# Patient Record
Sex: Male | Born: 1950 | Race: White | Hispanic: No | Marital: Single | Smoking: Never smoker
Health system: Southern US, Community
[De-identification: ages and names within clinical notes are randomized; demographics above are authoritative.]

## PROBLEM LIST (undated history)

## (undated) DIAGNOSIS — Z5189 Encounter for other specified aftercare: Secondary | ICD-10-CM

## (undated) DIAGNOSIS — E079 Disorder of thyroid, unspecified: Secondary | ICD-10-CM

## (undated) DIAGNOSIS — N189 Chronic kidney disease, unspecified: Secondary | ICD-10-CM

## (undated) DIAGNOSIS — C8590 Non-Hodgkin lymphoma, unspecified, unspecified site: Secondary | ICD-10-CM

## (undated) DIAGNOSIS — K5790 Diverticulosis of intestine, part unspecified, without perforation or abscess without bleeding: Secondary | ICD-10-CM

## (undated) DIAGNOSIS — C801 Malignant (primary) neoplasm, unspecified: Secondary | ICD-10-CM

## (undated) HISTORY — DX: Non-Hodgkin lymphoma, unspecified, unspecified site: C85.90

## (undated) HISTORY — PX: PORTACATH PLACEMENT: SHX2246

## (undated) HISTORY — PX: LUNG BIOPSY: SHX232

## (undated) HISTORY — DX: Chronic kidney disease, unspecified: N18.9

## (undated) HISTORY — PX: PORT-A-CATH REMOVAL: SHX5289

## (undated) HISTORY — PX: TONSILLECTOMY: SUR1361

## (undated) HISTORY — DX: Disorder of thyroid, unspecified: E07.9

## (undated) HISTORY — DX: Diverticulosis of intestine, part unspecified, without perforation or abscess without bleeding: K57.90

## (undated) HISTORY — DX: Encounter for other specified aftercare: Z51.89

---

## 2010-02-20 ENCOUNTER — Encounter (INDEPENDENT_AMBULATORY_CARE_PROVIDER_SITE_OTHER): Payer: Self-pay | Admitting: *Deleted

## 2010-08-26 ENCOUNTER — Encounter (INDEPENDENT_AMBULATORY_CARE_PROVIDER_SITE_OTHER): Payer: Self-pay | Admitting: *Deleted

## 2010-10-01 ENCOUNTER — Encounter (INDEPENDENT_AMBULATORY_CARE_PROVIDER_SITE_OTHER): Payer: Self-pay | Admitting: *Deleted

## 2010-10-03 ENCOUNTER — Ambulatory Visit: Payer: Self-pay | Admitting: Internal Medicine

## 2010-10-17 ENCOUNTER — Ambulatory Visit: Payer: Self-pay | Admitting: Internal Medicine

## 2010-10-21 ENCOUNTER — Telehealth (INDEPENDENT_AMBULATORY_CARE_PROVIDER_SITE_OTHER): Payer: Self-pay | Admitting: *Deleted

## 2010-10-25 ENCOUNTER — Ambulatory Visit: Payer: Self-pay | Admitting: Oncology

## 2010-10-25 ENCOUNTER — Telehealth: Payer: Self-pay | Admitting: Internal Medicine

## 2010-10-25 DIAGNOSIS — C8313 Mantle cell lymphoma, intra-abdominal lymph nodes: Secondary | ICD-10-CM

## 2010-10-28 ENCOUNTER — Ambulatory Visit: Payer: Self-pay | Admitting: Internal Medicine

## 2010-10-28 LAB — CONVERTED CEMR LAB
ALT: 17 units/L (ref 0–53)
Basophils Absolute: 0 10*3/uL (ref 0.0–0.1)
CO2: 29 meq/L (ref 19–32)
Calcium: 8.9 mg/dL (ref 8.4–10.5)
Chloride: 104 meq/L (ref 96–112)
Creatinine, Ser: 1.1 mg/dL (ref 0.4–1.5)
Eosinophils Relative: 2.5 % (ref 0.0–5.0)
GFR calc non Af Amer: 75.71 mL/min (ref >60)
Glucose, Bld: 97 mg/dL (ref 70–99)
Hemoglobin: 14.3 g/dL (ref 13.0–17.0)
Lymphocytes Relative: 22.8 % (ref 12.0–46.0)
Monocytes Relative: 7.3 % (ref 3.0–12.0)
Neutro Abs: 3.8 10*3/uL (ref 1.4–7.7)
RBC: 4.46 M/uL (ref 4.22–5.81)
RDW: 13.3 % (ref 11.5–14.6)
Total Protein: 5.9 g/dL — ABNORMAL LOW (ref 6.0–8.3)
WBC: 5.8 10*3/uL (ref 4.5–10.5)

## 2010-10-29 LAB — CONVERTED CEMR LAB: LDH: 147 units/L (ref 94–250)

## 2010-11-04 ENCOUNTER — Ambulatory Visit (HOSPITAL_COMMUNITY)
Admission: RE | Admit: 2010-11-04 | Discharge: 2010-11-04 | Payer: Self-pay | Source: Home / Self Care | Admitting: Internal Medicine

## 2010-11-12 ENCOUNTER — Encounter: Payer: Self-pay | Admitting: Internal Medicine

## 2010-11-25 ENCOUNTER — Ambulatory Visit (HOSPITAL_COMMUNITY)
Admission: RE | Admit: 2010-11-25 | Discharge: 2010-11-25 | Payer: Self-pay | Source: Home / Self Care | Attending: Oncology | Admitting: Oncology

## 2010-11-25 ENCOUNTER — Ambulatory Visit: Payer: Self-pay | Admitting: Oncology

## 2010-11-26 ENCOUNTER — Ambulatory Visit (HOSPITAL_COMMUNITY)
Admission: RE | Admit: 2010-11-26 | Discharge: 2010-11-26 | Payer: Self-pay | Source: Home / Self Care | Attending: Oncology | Admitting: Oncology

## 2010-11-27 ENCOUNTER — Inpatient Hospital Stay (HOSPITAL_COMMUNITY)
Admission: AD | Admit: 2010-11-27 | Discharge: 2010-12-01 | Payer: Self-pay | Source: Home / Self Care | Attending: Oncology | Admitting: Oncology

## 2010-11-27 ENCOUNTER — Encounter: Payer: Self-pay | Admitting: Oncology

## 2010-12-06 LAB — CBC WITH DIFFERENTIAL/PLATELET
Basophils Absolute: 0 10*3/uL (ref 0.0–0.1)
Eosinophils Absolute: 0.1 10*3/uL (ref 0.0–0.5)
HGB: 13.9 g/dL (ref 13.0–17.1)
MONO#: 0 10*3/uL — ABNORMAL LOW (ref 0.1–0.9)
NEUT#: 0.2 10*3/uL — CL (ref 1.5–6.5)
RDW: 13.1 % (ref 11.0–14.6)
WBC: 0.9 10*3/uL — CL (ref 4.0–10.3)
lymph#: 0.5 10*3/uL — ABNORMAL LOW (ref 0.9–3.3)
nRBC: 0 % (ref 0–0)

## 2010-12-11 ENCOUNTER — Encounter: Payer: Self-pay | Admitting: Internal Medicine

## 2010-12-11 LAB — CBC WITH DIFFERENTIAL/PLATELET
Eosinophils Absolute: 0 10*3/uL (ref 0.0–0.5)
HCT: 38.6 % (ref 38.4–49.9)
LYMPH%: 10.9 % — ABNORMAL LOW (ref 14.0–49.0)
MONO#: 1 10*3/uL — ABNORMAL HIGH (ref 0.1–0.9)
NEUT#: 8.6 10*3/uL — ABNORMAL HIGH (ref 1.5–6.5)
NEUT%: 79 % — ABNORMAL HIGH (ref 39.0–75.0)
Platelets: 168 10*3/uL (ref 140–400)
WBC: 10.9 10*3/uL — ABNORMAL HIGH (ref 4.0–10.3)
lymph#: 1.2 10*3/uL (ref 0.9–3.3)

## 2010-12-18 ENCOUNTER — Inpatient Hospital Stay (HOSPITAL_COMMUNITY)
Admission: AD | Admit: 2010-12-18 | Discharge: 2010-12-22 | Payer: Self-pay | Source: Home / Self Care | Attending: Oncology | Admitting: Oncology

## 2010-12-18 LAB — LACTATE DEHYDROGENASE: LDH: 202 U/L (ref 94–250)

## 2010-12-18 LAB — CBC WITH DIFFERENTIAL/PLATELET
BASO%: 1 % (ref 0.0–2.0)
Basophils Absolute: 0.1 10*3/uL (ref 0.0–0.1)
EOS%: 0.3 % (ref 0.0–7.0)
Eosinophils Absolute: 0 10*3/uL (ref 0.0–0.5)
HCT: 36.6 % — ABNORMAL LOW (ref 38.4–49.9)
HGB: 12.6 g/dL — ABNORMAL LOW (ref 13.0–17.1)
LYMPH%: 9.4 % — ABNORMAL LOW (ref 14.0–49.0)
MCH: 30.5 pg (ref 27.2–33.4)
MCHC: 34.4 g/dL (ref 32.0–36.0)
MCV: 88.6 fL (ref 79.3–98.0)
MONO#: 1.2 10*3/uL — ABNORMAL HIGH (ref 0.1–0.9)
MONO%: 10.6 % (ref 0.0–14.0)
NEUT#: 9 10*3/uL — ABNORMAL HIGH (ref 1.5–6.5)
NEUT%: 78.7 % — ABNORMAL HIGH (ref 39.0–75.0)
Platelets: 325 10*3/uL (ref 140–400)
RBC: 4.13 10*6/uL — ABNORMAL LOW (ref 4.20–5.82)
RDW: 13.7 % (ref 11.0–14.6)
WBC: 11.4 10*3/uL — ABNORMAL HIGH (ref 4.0–10.3)
lymph#: 1.1 10*3/uL (ref 0.9–3.3)

## 2010-12-18 LAB — URINALYSIS, MICROSCOPIC - CHCC
Bilirubin (Urine): NEGATIVE
Blood: NEGATIVE
Glucose: NEGATIVE g/dL
Ketones: NEGATIVE mg/dL
Nitrite: NEGATIVE
Protein: NEGATIVE mg/dL
RBC count: NEGATIVE (ref 0–2)
Specific Gravity, Urine: 1.025 (ref 1.003–1.035)
pH: 6 (ref 4.6–8.0)

## 2010-12-18 LAB — COMPREHENSIVE METABOLIC PANEL
ALT: 32 U/L (ref 0–53)
AST: 27 U/L (ref 0–37)
Albumin: 3.6 g/dL (ref 3.5–5.2)
Alkaline Phosphatase: 58 U/L (ref 39–117)
BUN: 17 mg/dL (ref 6–23)
CO2: 28 mEq/L (ref 19–32)
Calcium: 9.2 mg/dL (ref 8.4–10.5)
Chloride: 104 mEq/L (ref 96–112)
Creatinine, Ser: 1.18 mg/dL (ref 0.40–1.50)
Glucose, Bld: 87 mg/dL (ref 70–99)
Potassium: 4.2 mEq/L (ref 3.5–5.3)
Sodium: 139 mEq/L (ref 135–145)
Total Bilirubin: 0.8 mg/dL (ref 0.3–1.2)
Total Protein: 6.6 g/dL (ref 6.0–8.3)

## 2010-12-18 LAB — URIC ACID: Uric Acid, Serum: 7.9 mg/dL — ABNORMAL HIGH (ref 4.0–7.8)

## 2010-12-25 ENCOUNTER — Ambulatory Visit (HOSPITAL_BASED_OUTPATIENT_CLINIC_OR_DEPARTMENT_OTHER): Payer: BC Managed Care – PPO | Admitting: Oncology

## 2010-12-27 LAB — CBC WITH DIFFERENTIAL/PLATELET
BASO%: 0.4 % (ref 0.0–2.0)
Basophils Absolute: 0 10*3/uL (ref 0.0–0.1)
EOS%: 6.9 % (ref 0.0–7.0)
Eosinophils Absolute: 0.2 10*3/uL (ref 0.0–0.5)
HCT: 34.2 % — ABNORMAL LOW (ref 38.4–49.9)
HGB: 11.9 g/dL — ABNORMAL LOW (ref 13.0–17.1)
LYMPH%: 16.8 % (ref 14.0–49.0)
MCH: 31.6 pg (ref 27.2–33.4)
MCHC: 34.7 g/dL (ref 32.0–36.0)
MCV: 90.8 fL (ref 79.3–98.0)
MONO#: 0 10*3/uL — ABNORMAL LOW (ref 0.1–0.9)
MONO%: 0.7 % (ref 0.0–14.0)
NEUT#: 2 10*3/uL (ref 1.5–6.5)
NEUT%: 75.2 % — ABNORMAL HIGH (ref 39.0–75.0)
Platelets: 173 10*3/uL (ref 140–400)
RBC: 3.76 10*6/uL — ABNORMAL LOW (ref 4.20–5.82)
RDW: 13.9 % (ref 11.0–14.6)
WBC: 2.6 10*3/uL — ABNORMAL LOW (ref 4.0–10.3)
lymph#: 0.4 10*3/uL — ABNORMAL LOW (ref 0.9–3.3)

## 2011-01-01 LAB — CBC WITH DIFFERENTIAL/PLATELET
BASO%: 0.5 % (ref 0.0–2.0)
Basophils Absolute: 0.1 10*3/uL (ref 0.0–0.1)
EOS%: 1 % (ref 0.0–7.0)
Eosinophils Absolute: 0.2 10*3/uL (ref 0.0–0.5)
HCT: 32.4 % — ABNORMAL LOW (ref 38.4–49.9)
HGB: 11.1 g/dL — ABNORMAL LOW (ref 13.0–17.1)
LYMPH%: 8.9 % — ABNORMAL LOW (ref 14.0–49.0)
MCH: 31.1 pg (ref 27.2–33.4)
MCHC: 34.2 g/dL (ref 32.0–36.0)
MCV: 91.1 fL (ref 79.3–98.0)
MONO#: 1.5 10*3/uL — ABNORMAL HIGH (ref 0.1–0.9)
MONO%: 8.9 % (ref 0.0–14.0)
NEUT#: 13.6 10*3/uL — ABNORMAL HIGH (ref 1.5–6.5)
NEUT%: 80.7 % — ABNORMAL HIGH (ref 39.0–75.0)
Platelets: 142 10*3/uL (ref 140–400)
RBC: 3.55 10*6/uL — ABNORMAL LOW (ref 4.20–5.82)
RDW: 14.1 % (ref 11.0–14.6)
WBC: 16.8 10*3/uL — ABNORMAL HIGH (ref 4.0–10.3)
lymph#: 1.5 10*3/uL (ref 0.9–3.3)

## 2011-01-02 ENCOUNTER — Encounter: Payer: Self-pay | Admitting: Internal Medicine

## 2011-01-06 ENCOUNTER — Ambulatory Visit (HOSPITAL_COMMUNITY)
Admission: RE | Admit: 2011-01-06 | Discharge: 2011-01-06 | Payer: Self-pay | Source: Home / Self Care | Attending: Oncology | Admitting: Oncology

## 2011-01-07 LAB — GLUCOSE, CAPILLARY: Glucose-Capillary: 91 mg/dL (ref 70–99)

## 2011-01-10 ENCOUNTER — Encounter: Payer: Self-pay | Admitting: Internal Medicine

## 2011-01-10 LAB — CBC WITH DIFFERENTIAL/PLATELET
BASO%: 0 % (ref 0.0–2.0)
EOS%: 0.6 % (ref 0.0–7.0)
MCH: 31.2 pg (ref 27.2–33.4)
MCHC: 33.9 g/dL (ref 32.0–36.0)
MONO#: 0 10*3/uL — ABNORMAL LOW (ref 0.1–0.9)
NEUT%: 90.6 % — ABNORMAL HIGH (ref 39.0–75.0)
RBC: 3.77 10*6/uL — ABNORMAL LOW (ref 4.20–5.82)
RDW: 15.9 % — ABNORMAL HIGH (ref 11.0–14.6)
WBC: 11.5 10*3/uL — ABNORMAL HIGH (ref 4.0–10.3)
lymph#: 1 10*3/uL (ref 0.9–3.3)

## 2011-01-13 NOTE — H&P (Signed)
NAMEDIMAS, SCHECK NO.:  [id number]  MEDICAL RECORD NO.:  [id number]          PATIENT TYPE:  INP  LOCATION:  1338                         FACILITY:  [facility]  PHYSICIAN:  Leighton Roach. Truett Perna, M.D. DATE OF BIRTH:  1951-07-16  DATE OF ADMISSION:  12/18/2010 DATE OF DISCHARGE:                             HISTORY & PHYSICAL   PATIENT IDENTIFICATION:  Mr. Mark [name] is a 60 year old with mantle cell lymphoma.  He is now admitted for cycle #2 of R-EPOCH chemotherapy.  HISTORY OF PRESENT ILLNESS:  Mr. Veltre underwent a routine screening colonoscopy by Dr. Marina Goodell on October 17, 2010.  An atypical appearing "polyp" was found at the ileocecal valve.  There was a prominent fold at the entrance of the ileocecal valve.  The polyp was removed and the ileocecal fold was biopsied.  The terminal ileum appeared normal.  The pathology from the ileocecal valve polyp and prominent fold both were involved with mantle cell lymphoma.  A cyclin D1 stain was positive. The lymphocytes were positive for CD5 and negative for CD10.  The lymphocytes were positive for CD20.  The Ki-67 proliferation marker returned at 40%.  A staging PET scan on November 04, 2010, confirmed hypermetabolic lymphadenopathy at the hepatoduodenal ligament, portocaval area, and at the small bowel mesentery.  A large lymph node was noted at the ileocolic mesentery.  Abnormal soft tissue attenuation was seen at the cecum with involvement of the ileocecal valve with a maximum SUV of 13.  Small hypermetabolic nodes were also seen in the inguinal regions and there was a small hypermetabolic femoral node.  There was no evidence for hypermetabolic disease in the chest or neck.  A bone marrow biopsy on December 12th was negative for involvement with lymphoma.  He saw Dr. Greggory Stallion at [facility] for a second opinion regarding management of the newly diagnosed mantle cell lymphoma and the indication for stem cell therapy.  Dr.  Greggory Stallion recommended induction therapy with R-EPOCH prior to proceeding with stem cell therapy.  He was admitted for a first cycle of R-EPOCH on December 14.  He tolerated the chemotherapy without significant acute toxicity.  When he returned on December 23, the absolute neutrophil cell count was low at 0.2 with a platelet count of 106,000.  On December 28, the ANC was up to 8.6 with a platelet count of 168,000.  He reports feeling completely well.  PAST MEDICAL HISTORY: 1. Mantle cell lymphoma as per HPI. 2. Status post removal of multiple basal cell skin cancers.  PAST SURGICAL HISTORY:  Tonsillectomy in 2nd grade.  CURRENT MEDICATIONS: 1. Compazine 10 mg q.6 h. p.r.n. 2. Juice Plus 4 capsules daily. 3. Vitamin D3 3000 units daily. 4. Multivitamin daily 5. Vitamin B-50 complex daily. 6. Advil 200 mg p.r.n.  ALLERGIES:  No known drug allergies.  FAMILY HISTORY:  His brother died following an allogeneic bone marrow transplant for treatment of lymphoma.  His mother died at age 27 of breast cancer.  His father had Alzheimer disease.  No other family history of cancer.  SOCIAL HISTORY:  He is divorced.  He is retired Buyer, retail.  Two children are  in good health.  He does not use tobacco.  He drinks alcohol on social occasions.  He has no transfusion history.  He denies risk factors for HIV and hepatitis.  REVIEW OF SYSTEMS:  CONSTITUTIONAL - negative.  RESPIRATORY - negative. CARDIAC - negative.  GI - negative.  GU - negative.  NEUROLOGIC: Negative.  PHYSICAL EXAMINATION:  VITAL SIGNS:  Blood pressure 124/83, pulse 82, temperature of 98.6. HEENT:  No thrush or ulcers. LUNGS:  Clear bilaterally. CARDIAC:  Regular rhythm.  No gallop. ABDOMEN:  Nontender.  No hepatosplenomegaly.  No mass. EXTREMITIES: No edema. NEUROLOGIC:  He is alert and oriented.  Motor exam appears grossly intact.  Right arm PICC site with faint erythema at the skin exit with a small amount of dried  blood.  No surrounding erythema or tenderness.  LABORATORY DATA:  Hemoglobin 12.6, platelets 325,000, white count 11.4, ANC 9.0.  Creatinine 1.18, potassium 4.2, bilirubin 0.8, AST 27, ALT 32, albumin 3.6, calcium 9.2, LDH 202, uric acid 7.9.  IMPRESSION: 1. Mantle cell lymphoma.     a.     Staging evaluation confirmed mantle cell lymphoma involving      a cecal mass and abdominal/pelvic lymph nodes.     b.     Status post cycle #1 R-EPOCH chemotherapy on November 27, 2010. 2. Neutropenia/thrombocytopenia following cycle #1 of R-EPOCH -     resolved. 3. History of multiple basal cell skin cancers.  PLAN: 1. Mr. Delima appears well.  He tolerated the first cycle of R-EPOCH     without significant acute toxicity.  He will     be admitted for cycle #2 of R-EPOCH today. 2. We will coordinate a restaging evaluation and stem cell therapy     with Dr. Greggory Stallion after this cycle.  We will coordinate a restaging     evaluation and stem cell therapy with Dr. Greggory Stallion after this cycle.     Leighton Roach Truett Perna, M.D.     GBS/MEDQ  D:  12/18/2010  T:  12/18/2010  Job:  [id number]  cc:   Marlaine Hind, MD Bone Marrow Transplant Program [facility]  Wilhemina Bonito. Marina Goodell, MD [address] [place] [address]  Jonita Albee, M.D. Fax: [phone]  Electronically Signed by Thornton Papas M.D. on 01/13/2011 02:16:50 PM

## 2011-01-14 ENCOUNTER — Telehealth (INDEPENDENT_AMBULATORY_CARE_PROVIDER_SITE_OTHER): Payer: Self-pay | Admitting: *Deleted

## 2011-01-14 NOTE — Progress Notes (Signed)
Summary: Dr. Dierdre Searles (Pathology results)  Phone Note From Other Clinic   Summary of Call: Dr.Li from pathology called to say path. showed a low to med. grade lymphoma and she will be forwarding report to Dr.Perry. Initial call taken by: Teryl Lucy RN,  October 21, 2010 11:55 AM

## 2011-01-14 NOTE — Miscellaneous (Signed)
Summary: [facility] previsit  Clinical Lists Changes  Medications: Added new medication of MOVIPREP 100 GM  SOLR (PEG-KCL-NACL-NASULF-NA ASC-C) As per prep instructions. - Signed Rx of MOVIPREP 100 GM  SOLR (PEG-KCL-NACL-NASULF-NA ASC-C) As per prep instructions.;  #1 x 0;  Signed;  Entered by: [name] RN;  Authorized by: Hilarie Fredrickson MD;  Method used: Electronically to [facility]*, [address], [place], [place]  [id number], Ph: [phone], Fax: [phone] Observations: Added new observation of NKA: T (10/03/2010 8:47)    Prescriptions: MOVIPREP 100 GM  SOLR (PEG-KCL-NACL-NASULF-NA ASC-C) As per prep instructions.  #1 x 0   Entered by:   [name] RN   Authorized by:   Hilarie Fredrickson MD   Signed by:   [name] RN on 10/03/2010   Method used:   Electronically to        [facility]* (retail)       [address]       [place], [place]  [id number]       Ph: [phone]       Fax: [phone]   RxID:   [id number]

## 2011-01-14 NOTE — Letter (Signed)
Summary: [facility]  [facility]   Imported By: Sherian Rein 11/18/2010 10:19:28  _____________________________________________________________________  External Attachment:    Type:   Image     Comment:   External Document

## 2011-01-14 NOTE — Progress Notes (Signed)
Summary: Returning call from yesterday  Phone Note Call from Patient Call back at [phone] cell   Call For: Dr Marina Goodell Summary of Call: Returning Dr Lamar Sprinkles call from yesterday. Initial call taken by: Leanor Kail [facility],  October 25, 2010 8:13 AM  Follow-up for Phone Call        We discussed the findings and recommendations in detail. Proceed with w/u per Dr Truett Perna and follow up w/ dr Truett Perna. Follow-up by: Hilarie Fredrickson MD,  October 25, 2010 1:01 PM     Appended Document: Returning call from yesterday Patient advised of PET scan and CT chest, abdomen, and pelvis ordered for 11/04/10 7:00.  Patient  is advised to be NPO aftermidnight and to arrive at Radiology at 6:45.  Patient  won't get oral contrast they will give it to him when he arrives at Radiology.  Patient will come one day next week for lab work.  He is asked to call back for any questions. Darcey Nora RN, [facility]  October 25, 2010 2:08 PM   Clinical Lists Changes  Problems: Added new problem of MANTLE CELL LYMPHOMA INTRA-ABDOMINAL LYMPH NODES (ICD-200.43) Orders: Added new Test order of PET Scan (PET Scan) - Signed Added new Test order of CT Abdomen/Pelvis w & w/o Contrast (CT A/P w&w/o Cont) - Signed Added new Test order of T-CT Chest w/CM (45409) - Signed

## 2011-01-14 NOTE — Letter (Signed)
Summary: Colonoscopy Letter  [facility]  [address] [place], [place] [address]   Phone: [phone]  Fax: [phone]      February 20, 2010 MRN: [id number]   [facility] [address] [place], [place]  [address]   Dear Mark [name],   According to your medical record, it is time for you to schedule a Colonoscopy. The American Cancer Society recommends this procedure as a method to detect early colon cancer. Patients with a family history of colon cancer, or a personal history of colon polyps or inflammatory bowel disease are at increased risk.  This letter has been generated based on the recommendations made at the time of your procedure. If you feel that in your particular situation this may no longer apply, please contact our office.  Please call our office at [phone] to schedule this appointment or to update your records at your earliest convenience.  Thank you for cooperating with [place] to provide you with the very best care possible.   Sincerely,  Wilhemina Bonito. Marina Goodell, M.D.  [facility] Gastroenterology Division [phone]

## 2011-01-14 NOTE — Letter (Signed)
Summary: Pre Visit Letter Revised  [facility]  [address] [place], [place] [address]   Phone: [phone]  Fax: [phone]        08/26/2010 MRN: [id number] [facility] [address] [place], [place]  [address]  [place]             Procedure Date:  10-17-10  Welcome to the Gastroenterology Division at [facility].    You are scheduled to see a nurse for your pre-procedure visit on 10-03-10 at 9:00A.M. on the 3rd floor at [facility], [address]  We ask that you try to arrive at our office 15 minutes prior to your appointment time to allow for check-in.  Please take a minute to review the attached form.  If you answer "Yes" to one or more of the questions on the first page, we ask that you call the person listed at your earliest opportunity.  If you answer "No" to all of the questions, please complete the rest of the form and bring it to your appointment.    Your nurse visit will consist of discussing your medical and surgical history, your immediate family medical history, and your medications.   If you are unable to list all of your medications on the form, please bring the medication bottles to your appointment and we will list them.  We will need to be aware of both prescribed and over the counter drugs.  We will need to know exact dosage information as well.    Please be prepared to read and sign documents such as consent forms, a financial agreement, and acknowledgement forms.  If necessary, and with your consent, a friend or relative is welcome to sit-in on the nurse visit with you.  Please bring your insurance card so that we may make a copy of it.  If your insurance requires a referral to see a specialist, please bring your referral form from your primary care physician.  No co-pay is required for this nurse visit.     If you cannot keep your appointment, please call [phone] to cancel or reschedule prior to your appointment date.   This allows [place] the opportunity to schedule an appointment for another patient in need of care.    Thank you for choosing [facility] for your medical needs.  We appreciate the opportunity to care for you.  Please visit [place] at our website  to learn more about our practice.  Sincerely, The Gastroenterology Division

## 2011-01-14 NOTE — Procedures (Signed)
Summary: Colonoscopy  Patient: [name] Note: All result statuses are Final unless otherwise noted.  Tests: (1) Colonoscopy (COL)   COL Colonoscopy           DONE     [facility]     [address]     [place], [place]  [address]           COLONOSCOPY PROCEDURE REPORT           PATIENT:  Mark, [name]  MR#:  [id number]     BIRTHDATE:  [birth year], 59 yrs. old  GENDER:  male     ENDOSCOPIST:  Wilhemina Bonito. Eda Keys, MD     REF. BY:  Screening Recall     PROCEDURE DATE:  10/17/2010     PROCEDURE:  Colonoscopy with biopsy,     Colonoscopy with snare polypectomy     x 1     ASA CLASS:  Class I     INDICATIONS:  Routine Risk Screening     MEDICATIONS:   Fentanyl 50 mcg IV, Versed 7 mg IV           DESCRIPTION OF PROCEDURE:   After the risks benefits and     alternatives of the procedure were thoroughly explained, informed     consent was obtained.  Digital rectal exam was performed and     revealed no abnormalities.   The LB 180AL [id number] endoscope was     introduced through the anus and advanced to the cecum, which was     identified by both the appendix and ileocecal valve, without     limitations.Time = 2:39 min.  The quality of the prep was     excellent, using MoviPrep.  The instrument was then slowly     withdrawn (time = 15:23 min) as the colon was fully examined.     <<PROCEDUREIMAGES>>           FINDINGS:  An atypical appearring diminutive polyp was found at     the ileocecal valve. Polyp was snared without cautery. Retrieval     was successful. As well, prominent fold to entrance of ICV (?     significance) was biopsied. The T.I. was deeply intubated and     normal. Moderate diverticulosis was found in the sigmoid colon.     Retroflexed views in the rectum revealed no abnormalities.    The     scope was then withdrawn from the patient and the procedure     completed.           COMPLICATIONS:  None     ENDOSCOPIC IMPRESSION:     1) Diminutive polyp at the  ileocecal valve - removed     2) Moderate diverticulosis in the sigmoid colon     3) Prominent fold ICV -bx     RECOMMENDATIONS:     1) Await biopsy results     2) Repeat colonoscopy in 5 years if polyp adenomatous; otherwise     10 years     ______________________________     Wilhemina Bonito. Eda Keys, MD           CC:  Robert Bellow, MD; The Patient           n.     eSIGNED:   Wilhemina Bonito. Eda Keys at 10/17/2010 09:32 AM           Tana Felts, [id number]  Note: An exclamation mark (!) indicates a result that was  not dispersed into the flowsheet. Document Creation Date: 10/17/2010 9:32 AM _______________________________________________________________________  (1) Order result status: Final Collection or observation date-time: 10/17/2010 09:05 Requested date-time:  Receipt date-time:  Reported date-time:  Referring Physician:   Ordering Physician: Fransico Setters [phone]) Specimen Source:  Source: Launa Grill Order Number: [id number] Lab site:

## 2011-01-14 NOTE — Letter (Signed)
Summary: [facility] Instructions  [facility]  [address] [place], [place] [address]   Phone: [phone]  Fax: [phone]       Mark [name]    1951-12-07    MRN: [id number]        Procedure Day [name]: [name] 10/17/10     Arrival Time:  7:30am     Procedure Time: 8:30am     Location of Procedure:                    _ X_  [facility] (4th Floor)                        PREPARATION FOR COLONOSCOPY WITH MOVIPREP   Starting 5 days prior to your procedure  SATURDAY 10/29  do not eat nuts, seeds, popcorn, corn, beans, peas,  salads, or any raw vegetables.  Do not take any fiber supplements (e.g. Metamucil, Citrucel, and Benefiber).  THE DAY BEFORE YOUR PROCEDURE         DATE: [facility] 11/02  1.  Drink clear liquids the entire day-NO SOLID FOOD  2.  Do not drink anything colored red or purple.  Avoid juices with pulp.  No orange juice.  3.  Drink at least 64 oz. (8 glasses) of fluid/clear liquids during the day to prevent dehydration and help the prep work efficiently.  CLEAR LIQUIDS INCLUDE: Water Jello Ice Popsicles Tea (sugar ok, no milk/cream) Powdered fruit flavored drinks Coffee (sugar ok, no milk/cream) Gatorade Juice: apple, white grape, white cranberry  Lemonade Clear bullion, consomm, broth Carbonated beverages (any kind) Strained chicken noodle soup Hard Candy                             4.  In the morning, mix first dose of MoviPrep solution:    Empty 1 Pouch A and 1 Pouch B into the disposable container    Add lukewarm drinking water to the top line of the container. Mix to dissolve    Refrigerate (mixed solution should be used within 24 hrs)  5.  Begin drinking the prep at 5:00 p.m. The MoviPrep container is divided by 4 marks.   Every 15 minutes drink the solution down to the next mark (approximately 8 oz) until the full liter is complete.   6.  Follow completed prep with 16 oz of clear liquid of your choice  (Nothing red or purple).  Continue to drink clear liquids until bedtime.  7.  Before going to bed, mix second dose of MoviPrep solution:    Empty 1 Pouch A and 1 Pouch B into the disposable container    Add lukewarm drinking water to the top line of the container. Mix to dissolve    Refrigerate  THE DAY OF YOUR PROCEDURE      DATE: THURSDAY  11/03  Beginning at  3:30 a.m. (5 hours before procedure):         1. Every 15 minutes, drink the solution down to the next mark (approx 8 oz) until the full liter is complete.  2. Follow completed prep with 16 oz. of clear liquid of your choice.    3. You may drink clear liquids until 6:30am  (2 HOURS BEFORE PROCEDURE).   MEDICATION INSTRUCTIONS  Unless otherwise instructed, you should take regular prescription medications with a small sip of water   as early as possible the morning  of your procedure.        OTHER INSTRUCTIONS  You will need a responsible adult at least 61 years of age to accompany you and drive you home.   This person must remain in the waiting room during your procedure.  Wear loose fitting clothing that is easily removed.  Leave jewelry and other valuables at home.  However, you may wish to bring a book to read or  an iPod/MP3 player to listen to music as you wait for your procedure to start.  Remove all body piercing jewelry and leave at home.  Total time from sign-in until discharge is approximately 2-3 hours.  You should go home directly after your procedure and rest.  You can resume normal activities the  day after your procedure.  The day of your procedure you should not:   Drive   Make legal decisions   Operate machinery   Drink alcohol   Return to work  You will receive specific instructions about eating, activities and medications before you leave.    The above instructions have been reviewed and explained to me by  [name] RN  October 03, 2010 9:13 AM     I fully  understand and can verbalize these instructions _____________________________ Date _________

## 2011-01-15 NOTE — Discharge Summary (Addendum)
Mark [name], Mark [name] NO.:  [id number]  MEDICAL RECORD NO.:  [id number]          PATIENT TYPE:  INP  LOCATION:  1341                         FACILITY:  [facility]  PHYSICIAN:  Leighton Roach. Truett Perna, M.D. DATE OF BIRTH:  Nov 18, 1951  DATE OF ADMISSION:  11/27/2010 DATE OF DISCHARGE:  12/01/2010                              DISCHARGE SUMMARY   REASON FOR HOSPITALIZATION:  Mr. Goodchild is a 60 year old Caucasian male with recent diagnosis of mantle cell lymphoma.  He was admitted for cycle #1 of R-EPOCH chemotherapy.  He has tolerated the chemotherapy without difficulty and has no difficulty passing stool or urine.  He has had chest pain.  He received his chemotherapy via a right PICC line and had no difficulties with his central line.  LABORATORY DATA:  Upon admission, CBC:  WBC 8.1, hemoglobin 14.6, hematocrit 43.1 and platelet count 194,000.  ANC 6.1.  Comprehensive metabolic panel was unremarkable except for mildly elevated glucose of 144.  LDH was 169 and serum uric acid was 7.1.  The patient had a repeat comprehensive metabolic panel on December 16, which was completely normal with sodium 143, potassium 3.9, chloride 109, CO2 28, glucose 96, BUN 13, creatinine 1.14, total bilirubin of 0.7, alkaline phosphatase 34, SGOT of 19, SGPT 15, total protein 5.3, albumin 3.0, calcium 8.5.  DISCHARGE DIAGNOSES:  Mantle cell lymphoma involving cecal mass and abdominal/pelvic lymph nodes.  The patient is completely asymptomatic. The plan is for the patient to receive 2 cycles of R-EPOCH prior to proceeding with stem cell therapy with Dr. Marlaine Hind at [facility]. The patient did have a marrow biopsy in November 25, 2010.  The preliminary report indicated the bone marrow was negative for involvement with lymphoma.  PHYSICAL EXAMINATION ON DAY OF DISCHARGE:  HEENT:  Normocephalic, EOMI. Sclera nonicteric. NECK:  Without mass. LUNGS:  Clear to auscultation bilaterally. CARDIAC:  S1, S2.  Regular rate and rhythm without murmurs, gallops or rubs. ABDOMEN:  Nontender. EXTREMITIES:  No edema.  The patient has a right arm PICC site with gauze dressing.  No surrounding erythema. NEUROLOGIC:  The patient is alert and oriented.  Motor exam appears grossly intact. VITAL SIGNS:  On day of discharge, temperature 97.9, pulse 52, respirations 16, blood pressure 144/87, O2 sats 98% on room air.  CONDITION OF PATIENT ON DISCHARGE:  Stable.  DISCHARGE INSTRUCTIONS:  The patient is instructed to resume normal activities.  He is to follow up with Dr. Truett Perna at the [facility] office on 12/19 for Neulasta shot.  He will also have labs and office visit as previously scheduled.  The patient is instructed to call for fever greater than 101 or for bleeding or uncontrolled nausea and vomiting.  DISCHARGE MEDICATIONS:  As follows: 1. Compazine 10 mg p.o. q.6 h p.r.n. 2. Aspirin 81 mg p.o. by mouth daily. 3. Fish oil pills 1 capsule by mouth daily. 4. Vitamin B complex 50 mg 1 tablet by mouth daily. 5. Vitamin D 3 3000 units 1 tablet by mouth p.o. daily.  SUMMARY:  Mr. Allende tolerated his chemotherapy without difficulty.  He did not experience any significant nausea,  vomiting, mucositis, diarrhea or alopecia.  He is aware of the risk for leukopenia and thrombocytopenia and infections.  He did not have any allergic reactions and no evidence of cardiac toxicity.  He is due to receive Neulasta support tomorrow.  He will then have a nadir CBC on December 06, 2010. He is scheduled for return office visit on December 11, 2010.     Bobbe Medico, PA-C   ______________________________ Leighton Roach Truett Perna, M.D.    SJ/MEDQ  D:  12/01/2010  T:  12/01/2010  Job:  [id number]  Electronically Signed by Thornton Papas M.D. on 12/18/2010 07:18:42 AM Electronically Signed by Bobbe Medico  on 01/15/2011 04:31:23 PM

## 2011-01-16 ENCOUNTER — Other Ambulatory Visit: Payer: Self-pay | Admitting: Oncology

## 2011-01-16 DIAGNOSIS — C8590 Non-Hodgkin lymphoma, unspecified, unspecified site: Secondary | ICD-10-CM

## 2011-01-16 NOTE — Letter (Signed)
Summary: [facility]  [facility]   Imported By: Sherian Rein 01/08/2011 12:30:47  _____________________________________________________________________  External Attachment:    Type:   Image     Comment:   External Document

## 2011-01-17 NOTE — Discharge Summary (Signed)
  NAMEZACHRY, HOPFENSPERGER NO.:  [id number]  MEDICAL RECORD NO.:  [id number]          PATIENT TYPE:  INP  LOCATION:  1338                         FACILITY:  [facility]  PHYSICIAN:  Leighton Roach. Truett Perna, M.D. DATE OF BIRTH:  [birth year]  DATE OF ADMISSION:  12/18/2010 DATE OF DISCHARGE:  12/22/2010                              DISCHARGE SUMMARY   DISCHARGE DIAGNOSES:  Mantle cell lymphoma.  HISTORY AND REASON FOR HOSPITALIZATION.:  Mr. Skolnick is a very pleasant 60 year old Caucasian male recently diagnosed with mantle cell lymphoma. He was admitted to the inpatient oncology service for cycle number #2 of R-EPOCH chemotherapy.  He tolerated his second cycle of R-EPOCH chemotherapy without difficulty, this was given via his right PICC line. The central line had no problems with access or flow and showed no signs of infection.  He remained afebrile throughout his entire admission.  DISCHARGE CONDITION:  Good.  DISCHARGE DIET:  Regular.  DISCHARGE MEDICATIONS: 1. Aspirin 81 mg 1 tablet by mouth daily. 2. Fish oil over-the-counter 1 capsule by mouth daily. 3. Vitamin B complex 50 mg 1 tablet by mouth daily. 4. Vitamin D3 5000 units 1 tablet by mouth every other day.  DISCHARGE FOLLOWUP:  The patient is to follow up with Dr. Truett Perna on December 23, 2010, for an office visit as well as a Neulasta injection. He will continue to receive his PICC line care at the [facility] and he is to call for any fever or bleeding.     Tiana Loft, PA-C.   ______________________________ Leighton Roach Truett Perna, M.D.    AJ/MEDQ  D:  12/22/2010  T:  12/22/2010  Job:  [id number]  Electronically Signed by Tiana Loft PA. on 01/16/2011 03:36:04 PM Electronically Signed by Thornton Papas M.D. on 01/17/2011 04:11:11 PM

## 2011-01-20 ENCOUNTER — Other Ambulatory Visit (HOSPITAL_COMMUNITY): Payer: Self-pay

## 2011-01-20 ENCOUNTER — Ambulatory Visit (HOSPITAL_COMMUNITY)
Admission: RE | Admit: 2011-01-20 | Discharge: 2011-01-20 | Disposition: A | Discharge status: Home / Self Care | Payer: BC Managed Care – PPO | Source: Ambulatory Visit | Attending: Oncology | Admitting: Oncology

## 2011-01-20 ENCOUNTER — Other Ambulatory Visit: Payer: Self-pay | Admitting: Oncology

## 2011-01-20 DIAGNOSIS — C8590 Non-Hodgkin lymphoma, unspecified, unspecified site: Secondary | ICD-10-CM

## 2011-01-20 DIAGNOSIS — C8319 Mantle cell lymphoma, extranodal and solid organ sites: Secondary | ICD-10-CM | POA: Insufficient documentation

## 2011-01-21 ENCOUNTER — Inpatient Hospital Stay (HOSPITAL_COMMUNITY)
Admission: AD | Admit: 2011-01-21 | Discharge: 2011-01-25 | DRG: 410 | Disposition: A | Discharge status: Home / Self Care | Payer: BC Managed Care – PPO | Source: Ambulatory Visit | Attending: Oncology | Admitting: Oncology

## 2011-01-21 ENCOUNTER — Encounter (HOSPITAL_BASED_OUTPATIENT_CLINIC_OR_DEPARTMENT_OTHER): Payer: BC Managed Care – PPO | Admitting: Oncology

## 2011-01-21 DIAGNOSIS — C8313 Mantle cell lymphoma, intra-abdominal lymph nodes: Secondary | ICD-10-CM | POA: Diagnosis present

## 2011-01-21 DIAGNOSIS — C8589 Other specified types of non-Hodgkin lymphoma, extranodal and solid organ sites: Secondary | ICD-10-CM

## 2011-01-21 DIAGNOSIS — Z7982 Long term (current) use of aspirin: Secondary | ICD-10-CM

## 2011-01-21 DIAGNOSIS — Z803 Family history of malignant neoplasm of breast: Secondary | ICD-10-CM

## 2011-01-21 DIAGNOSIS — Z807 Family history of other malignant neoplasms of lymphoid, hematopoietic and related tissues: Secondary | ICD-10-CM

## 2011-01-21 DIAGNOSIS — Z5111 Encounter for antineoplastic chemotherapy: Principal | ICD-10-CM

## 2011-01-21 DIAGNOSIS — Z5112 Encounter for antineoplastic immunotherapy: Secondary | ICD-10-CM

## 2011-01-21 DIAGNOSIS — C8319 Mantle cell lymphoma, extranodal and solid organ sites: Secondary | ICD-10-CM

## 2011-01-21 DIAGNOSIS — Z85828 Personal history of other malignant neoplasm of skin: Secondary | ICD-10-CM

## 2011-01-21 LAB — CBC WITH DIFFERENTIAL/PLATELET
EOS%: 2.4 % (ref 0.0–7.0)
MCH: 31.9 pg (ref 27.2–33.4)
MCV: 92.8 fL (ref 79.3–98.0)
MONO%: 8.7 % (ref 0.0–14.0)
NEUT#: 6.6 10*3/uL — ABNORMAL HIGH (ref 1.5–6.5)
RBC: 4.03 10*6/uL — ABNORMAL LOW (ref 4.20–5.82)
RDW: 17.2 % — ABNORMAL HIGH (ref 11.0–14.6)

## 2011-01-21 LAB — COMPREHENSIVE METABOLIC PANEL
ALT: 23 U/L (ref 0–53)
AST: 27 U/L (ref 0–37)
Albumin: 3.9 g/dL (ref 3.5–5.2)
Alkaline Phosphatase: 42 U/L (ref 39–117)
Potassium: 4.3 mEq/L (ref 3.5–5.3)
Sodium: 140 mEq/L (ref 135–145)
Total Protein: 6.5 g/dL (ref 6.0–8.3)

## 2011-01-22 NOTE — Progress Notes (Signed)
  Phone Note Other Incoming   Request: Send information Summary of Call: Request for records received from [facility]. Request forwarded to [employer].     

## 2011-01-22 NOTE — Letter (Signed)
Summary: [facility]  [facility]   Imported By: Sherian Rein 01/17/2011 10:43:02  _____________________________________________________________________  External Attachment:    Type:   Image     Comment:   External Document

## 2011-01-25 DIAGNOSIS — C8319 Mantle cell lymphoma, extranodal and solid organ sites: Secondary | ICD-10-CM

## 2011-01-26 ENCOUNTER — Encounter (HOSPITAL_BASED_OUTPATIENT_CLINIC_OR_DEPARTMENT_OTHER): Payer: BC Managed Care – PPO | Admitting: Oncology

## 2011-01-26 DIAGNOSIS — Z5189 Encounter for other specified aftercare: Secondary | ICD-10-CM

## 2011-01-26 DIAGNOSIS — C8319 Mantle cell lymphoma, extranodal and solid organ sites: Secondary | ICD-10-CM

## 2011-01-30 ENCOUNTER — Other Ambulatory Visit: Payer: Self-pay | Admitting: Oncology

## 2011-01-30 DIAGNOSIS — D702 Other drug-induced agranulocytosis: Secondary | ICD-10-CM

## 2011-01-30 LAB — CBC WITH DIFFERENTIAL/PLATELET
Eosinophils Absolute: 0.3 10*3/uL (ref 0.0–0.5)
LYMPH%: 44.3 % (ref 14.0–49.0)
MCHC: 34.4 g/dL (ref 32.0–36.0)
MCV: 93.1 fL (ref 79.3–98.0)
MONO%: 2.2 % (ref 0.0–14.0)
Platelets: 80 10*3/uL — ABNORMAL LOW (ref 140–400)
RBC: 3.54 10*6/uL — ABNORMAL LOW (ref 4.20–5.82)

## 2011-01-31 ENCOUNTER — Encounter (HOSPITAL_BASED_OUTPATIENT_CLINIC_OR_DEPARTMENT_OTHER): Payer: BC Managed Care – PPO | Admitting: Oncology

## 2011-01-31 ENCOUNTER — Other Ambulatory Visit: Payer: Self-pay | Admitting: Oncology

## 2011-01-31 DIAGNOSIS — C8319 Mantle cell lymphoma, extranodal and solid organ sites: Secondary | ICD-10-CM

## 2011-01-31 LAB — CBC WITH DIFFERENTIAL/PLATELET
BASO%: 0.6 % (ref 0.0–2.0)
EOS%: 15.9 % — ABNORMAL HIGH (ref 0.0–7.0)
LYMPH%: 56.6 % — ABNORMAL HIGH (ref 14.0–49.0)
MCHC: 35 g/dL (ref 32.0–36.0)
MONO#: 0.1 10*3/uL (ref 0.1–0.9)
MONO%: 9.1 % (ref 0.0–14.0)
Platelets: 46 10*3/uL — ABNORMAL LOW (ref 140–400)
RBC: 3.66 10*6/uL — ABNORMAL LOW (ref 4.20–5.82)
WBC: 1 10*3/uL — ABNORMAL LOW (ref 4.0–10.3)

## 2011-02-01 ENCOUNTER — Ambulatory Visit (HOSPITAL_COMMUNITY)
Admission: RE | Admit: 2011-02-01 | Discharge: 2011-02-01 | Disposition: A | Discharge status: Home / Self Care | Payer: BC Managed Care – PPO | Source: Ambulatory Visit | Attending: Oncology | Admitting: Oncology

## 2011-02-01 DIAGNOSIS — C8589 Other specified types of non-Hodgkin lymphoma, extranodal and solid organ sites: Secondary | ICD-10-CM | POA: Insufficient documentation

## 2011-02-01 LAB — CBC
HCT: 35.1 % — ABNORMAL LOW (ref 39.0–52.0)
Platelets: 71 10*3/uL — ABNORMAL LOW (ref 150–400)
RDW: 14.8 % (ref 11.5–15.5)
WBC: 2.7 10*3/uL — ABNORMAL LOW (ref 4.0–10.5)

## 2011-02-05 ENCOUNTER — Encounter: Payer: BC Managed Care – PPO | Admitting: Oncology

## 2011-02-07 ENCOUNTER — Other Ambulatory Visit: Payer: Self-pay | Admitting: Oncology

## 2011-02-07 ENCOUNTER — Encounter (HOSPITAL_BASED_OUTPATIENT_CLINIC_OR_DEPARTMENT_OTHER): Payer: BC Managed Care – PPO | Admitting: Oncology

## 2011-02-07 DIAGNOSIS — C8319 Mantle cell lymphoma, extranodal and solid organ sites: Secondary | ICD-10-CM

## 2011-02-07 LAB — CBC WITH DIFFERENTIAL/PLATELET
BASO%: 2.9 % — ABNORMAL HIGH (ref 0.0–2.0)
Basophils Absolute: 0.3 10*3/uL — ABNORMAL HIGH (ref 0.0–0.1)
HCT: 33.1 % — ABNORMAL LOW (ref 38.4–49.9)
HGB: 11.3 g/dL — ABNORMAL LOW (ref 13.0–17.1)
LYMPH%: 14.5 % (ref 14.0–49.0)
MCHC: 34.2 g/dL (ref 32.0–36.0)
MONO#: 0.8 10*3/uL (ref 0.1–0.9)
NEUT%: 73.5 % (ref 39.0–75.0)
Platelets: 269 10*3/uL (ref 140–400)
WBC: 9.3 10*3/uL (ref 4.0–10.3)
lymph#: 1.3 10*3/uL (ref 0.9–3.3)

## 2011-02-11 NOTE — Letter (Signed)
Summary: [facility]  [facility]   Imported By: Sherian Rein 02/03/2011 11:25:57  _____________________________________________________________________  External Attachment:    Type:   Image     Comment:   External Document

## 2011-02-13 ENCOUNTER — Encounter (HOSPITAL_BASED_OUTPATIENT_CLINIC_OR_DEPARTMENT_OTHER): Payer: BC Managed Care – PPO | Admitting: Oncology

## 2011-02-13 DIAGNOSIS — Z452 Encounter for adjustment and management of vascular access device: Secondary | ICD-10-CM

## 2011-02-13 DIAGNOSIS — C8319 Mantle cell lymphoma, extranodal and solid organ sites: Secondary | ICD-10-CM

## 2011-02-18 ENCOUNTER — Encounter (HOSPITAL_BASED_OUTPATIENT_CLINIC_OR_DEPARTMENT_OTHER): Payer: BC Managed Care – PPO | Admitting: Oncology

## 2011-02-18 DIAGNOSIS — C8319 Mantle cell lymphoma, extranodal and solid organ sites: Secondary | ICD-10-CM

## 2011-02-18 DIAGNOSIS — Z452 Encounter for adjustment and management of vascular access device: Secondary | ICD-10-CM

## 2011-02-21 NOTE — H&P (Signed)
NAMEQUINDARIUS, CABELLO NO.:  [id number]  MEDICAL RECORD NO.:  [id number]           PATIENT TYPE:  I  LOCATION:  1341                         FACILITY:  [facility]  PHYSICIAN:  Leighton Roach. Truett Perna, M.D. DATE OF BIRTH:  1951-06-01  DATE OF ADMISSION:  01/21/2011 DATE OF DISCHARGE:                             HISTORY & PHYSICAL   PATIENT IDENTIFICATION:  Mark [name] is a 60 year old with a history of mantle cell lymphoma.  He is now admitted for a third cycle of R-EPOCH chemotherapy.  HISTORY OF PRESENT ILLNESS:  Mark [name] underwent a routine screening colonoscopy by Dr. Marina Goodell on October 17, 2010.  An atypical-appearing "polyp" was found at the ileocecal valve.  There was a prominent fold at the entrance of the ileocecal valve.  The polyp was removed and the ileocecal fold was biopsied.  The terminal ileum appeared normal.  The pathology from the ileocecal valve polyp and prominent fold both wereinvolved with mantle cell lymphoma.  A cyclin D1 stain was positive. The lymphocytes were positive for CD5 and negative for CD10.  The lymphocytes were positive for CD20.  The Ki-67 proliferation marker returned at 40%.  A staging PET scan on November 04, 2010, confirmed hypermetabolic lymphadenopathy at the hepatoduodenal ligament, portocaval area, and at the small-bowel mesentery.  A large lymph node was noted at the ileocolic mesentery.  Abnormal soft tissue attenuation was seen at the cecum with involvement of the ileocecal valve with a maximum SUV of 13. Small hypermetabolic nodes were also seen in the inguinal regions and there was a small hypermetabolic femoral node.  There was no evidence for hypermetabolic disease in the neck or chest.  A bone marrow biopsy on November 25, 2010, was negative for involvement with lymphoma.  He saw Dr. Greggory Stallion at [facility] for a second opinion regarding management of the mantle cell lymphoma and the indication for stem cell therapy. Dr.  Greggory Stallion recommended induction therapy with R-EPOCH prior to stem cell therapy.  Mark [name] completed two cycles of R-EPOCH with the last cycle initiated on 01/04.  He tolerated the chemotherapy without significant acute toxicity.  A restaging PET scan on 01/23 revealed a significant improvement in the ileocecal adenopathy with reduced size and metabolic activity of nodal activity in this vicinity.  Hypermetabolic activity remained at the terminal ileum with a possible filling defect at the terminal ileum.  Other regional lymph nodes have resolved.  Small pelvic lymph nodes are no longer hypermetabolic.  I have reviewed the restaging CT with a Lyda Jester.  There is remaining abnormal activity near the cecum/ileocecal valve with an apparent soft tissue component though the hypermetabolic activity has improved.  Mark [name] saw Dr. Greggory Stallion last week.  He recommends a third cycle of R- EPOCH prior to stem cell therapy.  Mark [name] reports feeling well.  He underwent placement of an apheresis catheter on 02/06.  He is now admitted for a third cycle of R-EPOCH.  PAST MEDICAL HISTORY: 1. Mantle cell lymphoma as per HPI. 2. Status post removal of multiple basal cell skin cancers.  PAST SURGICAL HISTORY:  Tonsillectomy in the second grade.  CURRENT MEDICATIONS: 1. Compazine 10 mg q.6 h p.r.n. 2. Juice Plus supplement 4 capsules daily. 3. Vitamin D3 3000 units every other day. 4. Vitamin B50 complex 1 tablet every other day. 5. Advil 200 mg p.r.n. 6. Aspirin 81 mg every other day.  ALLERGIES:  No known drug allergies.  FAMILY HISTORY:  His brother died after an allogeneic bone marrow transplant for non-Hodgkin's lymphoma.  His mother died at age 56 of breast cancer.  His father had Alzheimer's disease.  No other family history of cancer.  SOCIAL HISTORY:  He is divorced.  He is a retired Buyer, retail.  Two children are in good health.  He does not use tobacco.  He  drinks alcohol on social occasions.  He has no transfusion history.  He denies risk factors for HIV and hepatitis.  REVIEW OF SYSTEMS:  CONSTITUTIONAL:  Negative.  RESPIRATORY:  Negative. HEART:  Negative.  GU:  Negative.  GI:  Negative.  NEUROLOGIC: Negative. INFECTIOUS DISEASE:  Negative.  PHYSICAL EXAMINATION:  VITAL SIGNS:  Blood pressure 129/78, pulse 78, temperature 97.8.  HEENT:  No thrush or ulcers.  There is an apheresis catheter in place at the right lower neck with an intact dressing. LUNGS:  Clear bilaterally.  HEART:  Regular rhythm.  ABDOMEN:  No hepatosplenomegaly.  No mass.  Nontender.  EXTREMITIES:  No edema.  LABORATORY DATA:  Hemoglobin 12.9, platelets 250,000, white count 8.5, ANC 6.6, BUN 15, creatinine 1.13.  Alkaline phosphatase 42, AST 27, ALT 23, bilirubin 1.0, calcium 9.2, LDH 150.  IMPRESSION: 1. Mantle cell lymphoma:     a.     Staging evaluation confirmed mantle cell lymphoma involving      a cecal mass and abdominal/pelvic lymph nodes.     b.     Status post two cycles of R-EPOCH chemotherapy with cycle #2      initiated on December 18, 2010.     c.     Restaging PET scan on January 06, 2011, revealed a marked      decrease in the hypermetabolic mass at the cecum and resolution of      hypermetabolic activity in small pelvic lymph nodes.  There is      remaining hypermetabolic activity with a soft tissue component at      the ileocecal region. 2. Status post placement of an apheresis catheter on January 20, 2011.  Mark [name] appears well.  He will be admitted for cycle #3 of R-EPOCH today.  He has tolerated the first two cycles of R-EPOCH without significant acute toxicity.  He saw Dr. Greggory Stallion last week.  I discussed the case with Dr. Greggory Stallion and the plan is to proceed with high-dose chemotherapy and autologous stem cell support following this cycle of chemotherapy.  Mark [name] will again receive Neulasta support following chemotherapy.     Leighton Roach  Truett Perna, M.D.     GBS/MEDQ  D:  01/21/2011  T:  01/21/2011  Job:  [id number]  cc:   Jaclyn Prime. Greggory Stallion, M.D. Fax: [phone]  Wilhemina Bonito. Marina Goodell, MD [address] [place] [address]  Electronically Signed by Thornton Papas M.D. on 02/20/2011 08:30:31 PM

## 2011-02-24 LAB — HIV ANTIBODY (ROUTINE TESTING W REFLEX): HIV: NONREACTIVE

## 2011-02-24 LAB — COMPREHENSIVE METABOLIC PANEL
Albumin: 3 g/dL — ABNORMAL LOW (ref 3.5–5.2)
Alkaline Phosphatase: 34 U/L — ABNORMAL LOW (ref 39–117)
BUN: 13 mg/dL (ref 6–23)
CO2: 28 mEq/L (ref 19–32)
Chloride: 109 mEq/L (ref 96–112)
Creatinine, Ser: 1.14 mg/dL (ref 0.4–1.5)
GFR calc non Af Amer: >60 mL/min (ref >60)
Glucose, Bld: 96 mg/dL (ref 70–99)
Potassium: 3.9 mEq/L (ref 3.5–5.1)
Total Bilirubin: 0.7 mg/dL (ref 0.3–1.2)

## 2011-02-25 LAB — COMPREHENSIVE METABOLIC PANEL
AST: 25 U/L (ref 0–37)
BUN: 15 mg/dL (ref 6–23)
CO2: 27 mEq/L (ref 19–32)
Calcium: 9.3 mg/dL (ref 8.4–10.5)
Creatinine, Ser: 1.18 mg/dL (ref 0.4–1.5)
GFR calc Af Amer: >60 mL/min (ref >60)
GFR calc non Af Amer: >60 mL/min (ref >60)
Glucose, Bld: 144 mg/dL — ABNORMAL HIGH (ref 70–99)

## 2011-02-25 LAB — LACTATE DEHYDROGENASE: LDH: 169 U/L (ref 94–250)

## 2011-02-25 LAB — CBC
Hemoglobin: 14.6 g/dL (ref 13.0–17.0)
MCH: 31.6 pg (ref 26.0–34.0)
MCHC: 33.9 g/dL (ref 30.0–36.0)
MCV: 93 fL (ref 78.0–100.0)
Platelets: 181 10*3/uL (ref 150–400)
RBC: 4.54 MIL/uL (ref 4.22–5.81)
RDW: 13.6 % (ref 11.5–15.5)
WBC: 5.8 10*3/uL (ref 4.0–10.5)

## 2011-02-25 LAB — URIC ACID: Uric Acid, Serum: 7.1 mg/dL (ref 4.0–7.8)

## 2011-02-25 LAB — PROTIME-INR
INR: 0.98 (ref 0.00–1.49)
Prothrombin Time: 13.2 seconds (ref 11.6–15.2)

## 2011-02-25 LAB — CHROMOSOME ANALYSIS, BONE MARROW

## 2011-02-25 LAB — BONE MARROW EXAM

## 2011-02-25 LAB — APTT: aPTT: 28 seconds (ref 24–37)

## 2011-02-25 LAB — DIFFERENTIAL
Lymphocytes Relative: 17 % (ref 12–46)
Lymphs Abs: 1.4 10*3/uL (ref 0.7–4.0)
Neutro Abs: 6.1 10*3/uL (ref 1.7–7.7)
Neutrophils Relative %: 76 % (ref 43–77)

## 2011-02-25 LAB — HEPATITIS B CORE ANTIBODY, TOTAL: Hep B Core Total Ab: NEGATIVE

## 2011-02-25 LAB — HEPATITIS B SURFACE ANTIGEN: Hepatitis B Surface Ag: NEGATIVE

## 2011-02-25 LAB — GLUCOSE, CAPILLARY: Glucose-Capillary: 99 mg/dL (ref 70–99)

## 2011-03-03 ENCOUNTER — Other Ambulatory Visit: Payer: Self-pay | Admitting: Oncology

## 2011-03-03 ENCOUNTER — Encounter (HOSPITAL_BASED_OUTPATIENT_CLINIC_OR_DEPARTMENT_OTHER): Payer: BC Managed Care – PPO | Admitting: Oncology

## 2011-03-03 DIAGNOSIS — C8319 Mantle cell lymphoma, extranodal and solid organ sites: Secondary | ICD-10-CM

## 2011-03-03 DIAGNOSIS — Z452 Encounter for adjustment and management of vascular access device: Secondary | ICD-10-CM

## 2011-03-03 LAB — CBC WITH DIFFERENTIAL/PLATELET
BASO%: 0.3 % (ref 0.0–2.0)
EOS%: 0.5 % (ref 0.0–7.0)
MCH: 31.6 pg (ref 27.2–33.4)
MCHC: 33.9 g/dL (ref 32.0–36.0)
MCV: 93.3 fL (ref 79.3–98.0)
MONO%: 0.3 % (ref 0.0–14.0)
RDW: 14.7 % — ABNORMAL HIGH (ref 11.0–14.6)
lymph#: 0.1 10*3/uL — ABNORMAL LOW (ref 0.9–3.3)

## 2011-03-03 LAB — BASIC METABOLIC PANEL
Chloride: 104 mEq/L (ref 96–112)
Creatinine, Ser: 1 mg/dL (ref 0.40–1.50)
Potassium: 3.9 mEq/L (ref 3.5–5.3)

## 2011-03-03 LAB — MAGNESIUM: Magnesium: 2 mg/dL (ref 1.5–2.5)

## 2011-03-05 ENCOUNTER — Encounter (HOSPITAL_BASED_OUTPATIENT_CLINIC_OR_DEPARTMENT_OTHER): Payer: BC Managed Care – PPO | Admitting: Oncology

## 2011-03-05 ENCOUNTER — Other Ambulatory Visit: Payer: Self-pay | Admitting: Oncology

## 2011-03-05 DIAGNOSIS — C8319 Mantle cell lymphoma, extranodal and solid organ sites: Secondary | ICD-10-CM

## 2011-03-05 DIAGNOSIS — Z452 Encounter for adjustment and management of vascular access device: Secondary | ICD-10-CM

## 2011-03-05 LAB — CBC WITH DIFFERENTIAL/PLATELET
HGB: 10.7 g/dL — ABNORMAL LOW (ref 13.0–17.1)
MCV: 90.6 fL (ref 79.3–98.0)
Platelets: 31 10*3/uL — ABNORMAL LOW (ref 140–400)
RBC: 3.39 10*6/uL — ABNORMAL LOW (ref 4.20–5.82)
RDW: 14 % (ref 11.0–14.6)
WBC: <0.2 10*3/uL — CL (ref 4.0–10.3)

## 2011-03-06 ENCOUNTER — Encounter: Payer: BC Managed Care – PPO | Admitting: Oncology

## 2011-03-11 NOTE — Discharge Summary (Signed)
  NAMEHUTSON, Mark [name] NO.:  [id number]  MEDICAL RECORD NO.:  [id number]           PATIENT TYPE:  I  LOCATION:  1341                         FACILITY:  [facility]  PHYSICIAN:  Samul Dada, M.D.DATE OF BIRTH:  1951/05/06  DATE OF ADMISSION:  01/21/2011 DATE OF DISCHARGE:  01/25/2011                              DISCHARGE SUMMARY   DISCHARGE DIAGNOSES: 1. Admission for third cycle of Rituxan-EPOCH chemotherapy. 2. Mantle cell lymphoma. 3. Status post resection of multiple basal cell skin cancers.  PROCEDURES:  Administration of chemotherapy.  HISTORY:  Mark [name] is a 60 year old white male who was admitted to [facility] on Tuesday February 7 for administration of his third cycle of R-EPOCH chemotherapy in anticipation ultimately of high-dose therapy and stem cell rescue.  The patient has felt well, apparently had no complaints on admission.  On physical exam, there were no remarkable features.  Baseline lab data.  Hemoglobin was 12.9, white count 8.5, platelets 250,000.  BUN was 15, creatinine 1.13.  Liver function tests were normal.  HOSPITAL COURSE:  The patient was admitted electively for third cycle of chemotherapy.  He received the following treatment:  Rituxan 375 mg/m2 equals 750 mg IV on day one.  He received continuous infusion for 4 days with the following drugs: 1. Etoposide 50 mg/m2 equal to 100 mg. 2. Doxorubicin 10 mg/m2 equal to 20 mg. 3. Vincristine 0.4 mg/m2 equal to 0.8 mg IV.  The following doses were the dose for 24 hours and the patient received this continuous infusion for 4 days.  It appears that his body surface area was 2.0 m2.  His infusion ran uneventfully.  His infusion completed today and the patient received cyclophosphamide 750 mg/m2 equal to 1500 mg after the completion of his IV infusion.  The patient's course in the hospital was uneventful.  I do not see where he had any lab studies during his admission.  The patient  does have apheresis catheter in place in the right lower neck used for his infusion.  He is being discharged from the hospital today and is due for Neulasta to be given tomorrow at the [facility].  DISCHARGE MEDICATIONS:  Are as follows: 1. Aspirin 81 mg daily. 2. Fish oil 1 capsule daily. 3. Vitamin B complex 50 mg daily. 4. Vitamin D3 5000 units every other day.  Patient is discharged in stable condition.  He will have follow up with Dr. Elnita Maxwell.     Samul Dada, M.D.     DSM/MEDQ  D:  01/25/2011  T:  01/25/2011  Job:  [id number]  Electronically Signed by Kimberlee Nearing M.D. on 03/11/2011 03:54:21 PM

## 2011-03-19 ENCOUNTER — Other Ambulatory Visit: Payer: Self-pay | Admitting: Oncology

## 2011-03-19 ENCOUNTER — Encounter (HOSPITAL_BASED_OUTPATIENT_CLINIC_OR_DEPARTMENT_OTHER): Payer: BC Managed Care – PPO | Admitting: Oncology

## 2011-03-19 DIAGNOSIS — Z452 Encounter for adjustment and management of vascular access device: Secondary | ICD-10-CM

## 2011-03-19 DIAGNOSIS — C8319 Mantle cell lymphoma, extranodal and solid organ sites: Secondary | ICD-10-CM

## 2011-03-19 LAB — CBC WITH DIFFERENTIAL/PLATELET
Basophils Absolute: 0 10*3/uL (ref 0.0–0.1)
Eosinophils Absolute: 0 10*3/uL (ref 0.0–0.5)
HGB: 8.8 g/dL — ABNORMAL LOW (ref 13.0–17.1)
MCV: 90.5 fL (ref 79.3–98.0)
MONO#: 1.6 10*3/uL — ABNORMAL HIGH (ref 0.1–0.9)
MONO%: 7.2 % (ref 0.0–14.0)
NEUT#: 20 10*3/uL — ABNORMAL HIGH (ref 1.5–6.5)
RBC: 2.84 10*6/uL — ABNORMAL LOW (ref 4.20–5.82)
RDW: 15.1 % — ABNORMAL HIGH (ref 11.0–14.6)
WBC: 22.6 10*3/uL — ABNORMAL HIGH (ref 4.0–10.3)
lymph#: 0.9 10*3/uL (ref 0.9–3.3)

## 2011-03-19 LAB — BASIC METABOLIC PANEL
CO2: 29 mEq/L (ref 19–32)
Calcium: 8.4 mg/dL (ref 8.4–10.5)
Chloride: 104 mEq/L (ref 96–112)
Glucose, Bld: 112 mg/dL — ABNORMAL HIGH (ref 70–99)
Potassium: 3.8 mEq/L (ref 3.5–5.3)
Sodium: 141 mEq/L (ref 135–145)

## 2011-03-19 LAB — TECHNOLOGIST REVIEW

## 2011-03-24 ENCOUNTER — Encounter (HOSPITAL_BASED_OUTPATIENT_CLINIC_OR_DEPARTMENT_OTHER): Payer: BC Managed Care – PPO | Admitting: Oncology

## 2011-03-24 ENCOUNTER — Other Ambulatory Visit: Payer: Self-pay | Admitting: Oncology

## 2011-03-24 DIAGNOSIS — Z452 Encounter for adjustment and management of vascular access device: Secondary | ICD-10-CM

## 2011-03-24 DIAGNOSIS — C8319 Mantle cell lymphoma, extranodal and solid organ sites: Secondary | ICD-10-CM

## 2011-03-24 LAB — CBC WITH DIFFERENTIAL/PLATELET
Basophils Absolute: 0.1 10*3/uL (ref 0.0–0.1)
EOS%: 0 % (ref 0.0–7.0)
Eosinophils Absolute: 0 10*3/uL (ref 0.0–0.5)
HCT: 28.7 % — ABNORMAL LOW (ref 38.4–49.9)
HGB: 9.5 g/dL — ABNORMAL LOW (ref 13.0–17.1)
MCH: 29.9 pg (ref 27.2–33.4)
MCV: 90.3 fL (ref 79.3–98.0)
MONO%: 24.7 % — ABNORMAL HIGH (ref 0.0–14.0)
NEUT#: 8 10*3/uL — ABNORMAL HIGH (ref 1.5–6.5)
NEUT%: 57.6 % (ref 39.0–75.0)
Platelets: 405 10*3/uL — ABNORMAL HIGH (ref 140–400)
RDW: 13.6 % (ref 11.0–14.6)

## 2011-03-24 LAB — TECHNOLOGIST REVIEW

## 2011-03-31 ENCOUNTER — Encounter (HOSPITAL_BASED_OUTPATIENT_CLINIC_OR_DEPARTMENT_OTHER): Payer: BC Managed Care – PPO | Admitting: Oncology

## 2011-03-31 DIAGNOSIS — Z452 Encounter for adjustment and management of vascular access device: Secondary | ICD-10-CM

## 2011-03-31 DIAGNOSIS — C8319 Mantle cell lymphoma, extranodal and solid organ sites: Secondary | ICD-10-CM

## 2011-04-07 ENCOUNTER — Encounter: Payer: BC Managed Care – PPO | Admitting: Oncology

## 2011-04-14 ENCOUNTER — Encounter (HOSPITAL_BASED_OUTPATIENT_CLINIC_OR_DEPARTMENT_OTHER): Payer: BC Managed Care – PPO | Admitting: Oncology

## 2011-04-14 DIAGNOSIS — Z452 Encounter for adjustment and management of vascular access device: Secondary | ICD-10-CM

## 2011-04-14 DIAGNOSIS — C8319 Mantle cell lymphoma, extranodal and solid organ sites: Secondary | ICD-10-CM

## 2011-04-24 HISTORY — PX: BONE MARROW TRANSPLANT: SHX200

## 2011-05-06 ENCOUNTER — Other Ambulatory Visit: Payer: Self-pay | Admitting: Oncology

## 2011-05-06 DIAGNOSIS — C8590 Non-Hodgkin lymphoma, unspecified, unspecified site: Secondary | ICD-10-CM

## 2011-05-09 ENCOUNTER — Other Ambulatory Visit: Payer: Self-pay | Admitting: Oncology

## 2011-05-09 ENCOUNTER — Encounter (HOSPITAL_BASED_OUTPATIENT_CLINIC_OR_DEPARTMENT_OTHER): Payer: BC Managed Care – PPO | Admitting: Oncology

## 2011-05-09 DIAGNOSIS — C8319 Mantle cell lymphoma, extranodal and solid organ sites: Secondary | ICD-10-CM

## 2011-05-09 LAB — CBC WITH DIFFERENTIAL/PLATELET
Basophils Absolute: 0.1 10*3/uL (ref 0.0–0.1)
EOS%: 0 % (ref 0.0–7.0)
HCT: 29.1 % — ABNORMAL LOW (ref 38.4–49.9)
HGB: 9.9 g/dL — ABNORMAL LOW (ref 13.0–17.1)
LYMPH%: 16.8 % (ref 14.0–49.0)
MCH: 30.7 pg (ref 27.2–33.4)
MONO#: 1.7 10*3/uL — ABNORMAL HIGH (ref 0.1–0.9)
NEUT%: 58.1 % (ref 39.0–75.0)
Platelets: 89 10*3/uL — ABNORMAL LOW (ref 140–400)
lymph#: 1.2 10*3/uL (ref 0.9–3.3)

## 2011-05-09 LAB — BASIC METABOLIC PANEL
BUN: 17 mg/dL (ref 6–23)
CO2: 23 mEq/L (ref 19–32)
Glucose, Bld: 93 mg/dL (ref 70–99)
Potassium: 4.3 mEq/L (ref 3.5–5.3)
Sodium: 138 mEq/L (ref 135–145)

## 2011-05-13 ENCOUNTER — Other Ambulatory Visit: Payer: Self-pay | Admitting: Oncology

## 2011-05-13 ENCOUNTER — Encounter (HOSPITAL_BASED_OUTPATIENT_CLINIC_OR_DEPARTMENT_OTHER): Payer: BC Managed Care – PPO | Admitting: Oncology

## 2011-05-13 DIAGNOSIS — Z452 Encounter for adjustment and management of vascular access device: Secondary | ICD-10-CM

## 2011-05-13 DIAGNOSIS — C8319 Mantle cell lymphoma, extranodal and solid organ sites: Secondary | ICD-10-CM

## 2011-05-13 LAB — CBC WITH DIFFERENTIAL/PLATELET
Basophils Absolute: 0 10*3/uL (ref 0.0–0.1)
EOS%: 0.3 % (ref 0.0–7.0)
HCT: 28.4 % — ABNORMAL LOW (ref 38.4–49.9)
HGB: 9.9 g/dL — ABNORMAL LOW (ref 13.0–17.1)
LYMPH%: 7.9 % — ABNORMAL LOW (ref 14.0–49.0)
MCH: 32.4 pg (ref 27.2–33.4)
MCHC: 34.8 g/dL (ref 32.0–36.0)
MCV: 93.1 fL (ref 79.3–98.0)
MONO%: 17.7 % — ABNORMAL HIGH (ref 0.0–14.0)
NEUT%: 73.6 % (ref 39.0–75.0)

## 2011-05-13 LAB — BASIC METABOLIC PANEL
BUN: 19 mg/dL (ref 6–23)
Calcium: 9.4 mg/dL (ref 8.4–10.5)
Creatinine, Ser: 0.95 mg/dL (ref 0.40–1.50)
Glucose, Bld: 95 mg/dL (ref 70–99)
Potassium: 4.2 mEq/L (ref 3.5–5.3)

## 2011-05-19 ENCOUNTER — Encounter (HOSPITAL_BASED_OUTPATIENT_CLINIC_OR_DEPARTMENT_OTHER): Payer: BC Managed Care – PPO | Admitting: Oncology

## 2011-05-19 ENCOUNTER — Other Ambulatory Visit: Payer: Self-pay | Admitting: Oncology

## 2011-05-19 DIAGNOSIS — Z452 Encounter for adjustment and management of vascular access device: Secondary | ICD-10-CM

## 2011-05-19 DIAGNOSIS — C8319 Mantle cell lymphoma, extranodal and solid organ sites: Secondary | ICD-10-CM

## 2011-05-19 LAB — CBC WITH DIFFERENTIAL/PLATELET
EOS%: 6.2 % (ref 0.0–7.0)
Eosinophils Absolute: 0.5 10*3/uL (ref 0.0–0.5)
LYMPH%: 7 % — ABNORMAL LOW (ref 14.0–49.0)
MCH: 32.6 pg (ref 27.2–33.4)
MCHC: 34.5 g/dL (ref 32.0–36.0)
MCV: 94.5 fL (ref 79.3–98.0)
MONO%: 12.8 % (ref 0.0–14.0)
NEUT#: 6 10*3/uL (ref 1.5–6.5)
Platelets: 184 10*3/uL (ref 140–400)
RBC: 3.28 10*6/uL — ABNORMAL LOW (ref 4.20–5.82)
RDW: 18.9 % — ABNORMAL HIGH (ref 11.0–14.6)

## 2011-06-16 ENCOUNTER — Encounter (HOSPITAL_COMMUNITY): Payer: Self-pay

## 2011-06-16 ENCOUNTER — Inpatient Hospital Stay (HOSPITAL_COMMUNITY): Admission: RE | Admit: 2011-06-16 | Payer: BC Managed Care – PPO | Source: Ambulatory Visit

## 2011-06-16 ENCOUNTER — Encounter (HOSPITAL_COMMUNITY)
Admission: RE | Admit: 2011-06-16 | Discharge: 2011-06-16 | Disposition: A | Discharge status: Home / Self Care | Payer: BC Managed Care – PPO | Source: Ambulatory Visit | Attending: Oncology | Admitting: Oncology

## 2011-06-16 DIAGNOSIS — C8589 Other specified types of non-Hodgkin lymphoma, extranodal and solid organ sites: Secondary | ICD-10-CM | POA: Insufficient documentation

## 2011-06-16 DIAGNOSIS — C8590 Non-Hodgkin lymphoma, unspecified, unspecified site: Secondary | ICD-10-CM

## 2011-06-16 HISTORY — DX: Malignant (primary) neoplasm, unspecified: C80.1

## 2011-06-16 LAB — GLUCOSE, CAPILLARY: Glucose-Capillary: 103 mg/dL — ABNORMAL HIGH (ref 70–99)

## 2011-06-16 MED ORDER — FLUDEOXYGLUCOSE F - 18 (FDG) INJECTION
16.1000 | Freq: Once | INTRAVENOUS | Status: AC | PRN
  Administered 2011-06-16: 16.1 via INTRAVENOUS

## 2011-06-24 ENCOUNTER — Other Ambulatory Visit: Payer: Self-pay | Admitting: Oncology

## 2011-06-25 ENCOUNTER — Other Ambulatory Visit: Payer: Self-pay | Admitting: Oncology

## 2011-06-25 DIAGNOSIS — C8590 Non-Hodgkin lymphoma, unspecified, unspecified site: Secondary | ICD-10-CM

## 2011-06-26 ENCOUNTER — Ambulatory Visit (HOSPITAL_COMMUNITY)
Admission: RE | Admit: 2011-06-26 | Discharge: 2011-06-26 | Disposition: A | Discharge status: Home / Self Care | Payer: BC Managed Care – PPO | Source: Ambulatory Visit | Attending: Oncology | Admitting: Oncology

## 2011-06-26 ENCOUNTER — Other Ambulatory Visit (HOSPITAL_COMMUNITY): Payer: Self-pay | Admitting: Interventional Radiology

## 2011-06-26 ENCOUNTER — Other Ambulatory Visit: Payer: Self-pay | Admitting: Oncology

## 2011-06-26 DIAGNOSIS — T81507A Unspecified complication of foreign body accidentally left in body following removal of catheter or packing, initial encounter: Secondary | ICD-10-CM | POA: Insufficient documentation

## 2011-06-26 DIAGNOSIS — C8589 Other specified types of non-Hodgkin lymphoma, extranodal and solid organ sites: Secondary | ICD-10-CM | POA: Insufficient documentation

## 2011-06-26 DIAGNOSIS — C8590 Non-Hodgkin lymphoma, unspecified, unspecified site: Secondary | ICD-10-CM

## 2011-06-26 DIAGNOSIS — M795 Residual foreign body in soft tissue: Secondary | ICD-10-CM | POA: Insufficient documentation

## 2011-06-26 DIAGNOSIS — T81509A Unspecified complication of foreign body accidentally left in body following unspecified procedure, initial encounter: Secondary | ICD-10-CM | POA: Insufficient documentation

## 2011-06-27 ENCOUNTER — Ambulatory Visit (HOSPITAL_COMMUNITY)
Admission: RE | Admit: 2011-06-27 | Discharge: 2011-06-27 | Disposition: A | Discharge status: Home / Self Care | Payer: BC Managed Care – PPO | Source: Ambulatory Visit | Attending: Interventional Radiology | Admitting: Interventional Radiology

## 2011-06-27 ENCOUNTER — Other Ambulatory Visit (HOSPITAL_COMMUNITY): Payer: Self-pay | Admitting: Interventional Radiology

## 2011-06-27 DIAGNOSIS — C8590 Non-Hodgkin lymphoma, unspecified, unspecified site: Secondary | ICD-10-CM

## 2011-07-17 ENCOUNTER — Other Ambulatory Visit: Payer: Self-pay | Admitting: Oncology

## 2011-07-17 ENCOUNTER — Encounter (HOSPITAL_BASED_OUTPATIENT_CLINIC_OR_DEPARTMENT_OTHER): Payer: BC Managed Care – PPO | Admitting: Oncology

## 2011-07-17 DIAGNOSIS — C8589 Other specified types of non-Hodgkin lymphoma, extranodal and solid organ sites: Secondary | ICD-10-CM

## 2011-07-17 DIAGNOSIS — C8319 Mantle cell lymphoma, extranodal and solid organ sites: Secondary | ICD-10-CM

## 2011-07-17 LAB — CBC WITH DIFFERENTIAL/PLATELET
BASO%: 0.4 % (ref 0.0–2.0)
Basophils Absolute: 0 10*3/uL (ref 0.0–0.1)
EOS%: 2.6 % (ref 0.0–7.0)
Eosinophils Absolute: 0.1 10*3/uL (ref 0.0–0.5)
HCT: 27.2 % — ABNORMAL LOW (ref 38.4–49.9)
HGB: 9.4 g/dL — ABNORMAL LOW (ref 13.0–17.1)
LYMPH%: 12.3 % — ABNORMAL LOW (ref 14.0–49.0)
MCH: 33.1 pg (ref 27.2–33.4)
MCHC: 34.6 g/dL (ref 32.0–36.0)
MCV: 95.7 fL (ref 79.3–98.0)
MONO#: 0.3 10*3/uL (ref 0.1–0.9)
MONO%: 9 % (ref 0.0–14.0)
NEUT#: 2.7 10*3/uL (ref 1.5–6.5)
NEUT%: 75.7 % — ABNORMAL HIGH (ref 39.0–75.0)
Platelets: 168 10*3/uL (ref 140–400)
RBC: 2.85 10*6/uL — ABNORMAL LOW (ref 4.20–5.82)
RDW: 16 % — ABNORMAL HIGH (ref 11.0–14.6)
WBC: 3.6 10*3/uL — ABNORMAL LOW (ref 4.0–10.3)
lymph#: 0.4 10*3/uL — ABNORMAL LOW (ref 0.9–3.3)

## 2011-07-31 ENCOUNTER — Other Ambulatory Visit: Payer: Self-pay | Admitting: Oncology

## 2011-07-31 ENCOUNTER — Encounter (HOSPITAL_BASED_OUTPATIENT_CLINIC_OR_DEPARTMENT_OTHER): Payer: BC Managed Care – PPO | Admitting: Oncology

## 2011-07-31 DIAGNOSIS — D6959 Other secondary thrombocytopenia: Secondary | ICD-10-CM

## 2011-07-31 DIAGNOSIS — C8319 Mantle cell lymphoma, extranodal and solid organ sites: Secondary | ICD-10-CM

## 2011-07-31 LAB — COMPREHENSIVE METABOLIC PANEL
AST: 16 U/L (ref 0–37)
Albumin: 3.8 g/dL (ref 3.5–5.2)
Alkaline Phosphatase: 41 U/L (ref 39–117)
BUN: 18 mg/dL (ref 6–23)
Potassium: 4.1 mEq/L (ref 3.5–5.3)
Sodium: 142 mEq/L (ref 135–145)
Total Protein: 5.9 g/dL — ABNORMAL LOW (ref 6.0–8.3)

## 2011-07-31 LAB — CBC WITH DIFFERENTIAL/PLATELET
BASO%: 0 % (ref 0.0–2.0)
EOS%: 2.5 % (ref 0.0–7.0)
MCH: 34.6 pg — ABNORMAL HIGH (ref 27.2–33.4)
MCHC: 34.3 g/dL (ref 32.0–36.0)
RDW: 22.4 % — ABNORMAL HIGH (ref 11.0–14.6)
lymph#: 0.4 10*3/uL — ABNORMAL LOW (ref 0.9–3.3)

## 2011-07-31 LAB — HOLD TUBE, BLOOD BANK

## 2011-08-04 ENCOUNTER — Encounter (HOSPITAL_BASED_OUTPATIENT_CLINIC_OR_DEPARTMENT_OTHER): Payer: BC Managed Care – PPO | Admitting: Oncology

## 2011-08-04 DIAGNOSIS — Z5112 Encounter for antineoplastic immunotherapy: Secondary | ICD-10-CM

## 2011-08-04 DIAGNOSIS — C8319 Mantle cell lymphoma, extranodal and solid organ sites: Secondary | ICD-10-CM

## 2011-08-11 ENCOUNTER — Encounter (HOSPITAL_BASED_OUTPATIENT_CLINIC_OR_DEPARTMENT_OTHER): Payer: BC Managed Care – PPO | Admitting: Oncology

## 2011-08-11 DIAGNOSIS — Z5112 Encounter for antineoplastic immunotherapy: Secondary | ICD-10-CM

## 2011-08-11 DIAGNOSIS — C8319 Mantle cell lymphoma, extranodal and solid organ sites: Secondary | ICD-10-CM

## 2011-08-20 ENCOUNTER — Encounter (HOSPITAL_BASED_OUTPATIENT_CLINIC_OR_DEPARTMENT_OTHER): Payer: BC Managed Care – PPO | Admitting: Oncology

## 2011-08-20 DIAGNOSIS — C8319 Mantle cell lymphoma, extranodal and solid organ sites: Secondary | ICD-10-CM

## 2011-08-20 DIAGNOSIS — Z5112 Encounter for antineoplastic immunotherapy: Secondary | ICD-10-CM

## 2011-08-27 ENCOUNTER — Other Ambulatory Visit: Payer: Self-pay | Admitting: Oncology

## 2011-08-27 ENCOUNTER — Encounter (HOSPITAL_BASED_OUTPATIENT_CLINIC_OR_DEPARTMENT_OTHER): Payer: BC Managed Care – PPO | Admitting: Oncology

## 2011-08-27 DIAGNOSIS — C8319 Mantle cell lymphoma, extranodal and solid organ sites: Secondary | ICD-10-CM

## 2011-08-27 DIAGNOSIS — Z5111 Encounter for antineoplastic chemotherapy: Secondary | ICD-10-CM

## 2011-08-27 LAB — CBC WITH DIFFERENTIAL/PLATELET
Eosinophils Absolute: 0.2 10*3/uL (ref 0.0–0.5)
LYMPH%: 12.3 % — ABNORMAL LOW (ref 14.0–49.0)
MCV: 103.5 fL — ABNORMAL HIGH (ref 79.3–98.0)
MONO%: 8.9 % (ref 0.0–14.0)
NEUT#: 4.8 10*3/uL (ref 1.5–6.5)
Platelets: 139 10*3/uL — ABNORMAL LOW (ref 140–400)
RBC: 3.12 10*6/uL — ABNORMAL LOW (ref 4.20–5.82)
nRBC: 0 % (ref 0–0)

## 2011-09-23 ENCOUNTER — Other Ambulatory Visit: Payer: Self-pay | Admitting: Oncology

## 2011-09-23 ENCOUNTER — Encounter (HOSPITAL_BASED_OUTPATIENT_CLINIC_OR_DEPARTMENT_OTHER): Payer: BC Managed Care – PPO | Admitting: Oncology

## 2011-09-23 DIAGNOSIS — Z5112 Encounter for antineoplastic immunotherapy: Secondary | ICD-10-CM

## 2011-09-23 DIAGNOSIS — C8319 Mantle cell lymphoma, extranodal and solid organ sites: Secondary | ICD-10-CM

## 2011-09-23 LAB — CBC WITH DIFFERENTIAL/PLATELET
BASO%: 1.1 % (ref 0.0–2.0)
EOS%: 2.4 % (ref 0.0–7.0)
HCT: 31.8 % — ABNORMAL LOW (ref 38.4–49.9)
LYMPH%: 9.4 % — ABNORMAL LOW (ref 14.0–49.0)
MCH: 32.6 pg (ref 27.2–33.4)
MCHC: 32.7 g/dL (ref 32.0–36.0)
MCV: 99.7 fL — ABNORMAL HIGH (ref 79.3–98.0)
MONO%: 17.1 % — ABNORMAL HIGH (ref 0.0–14.0)
NEUT%: 70 % (ref 39.0–75.0)
Platelets: 123 10*3/uL — ABNORMAL LOW (ref 140–400)
lymph#: 0.8 10*3/uL — ABNORMAL LOW (ref 0.9–3.3)

## 2011-09-24 ENCOUNTER — Other Ambulatory Visit: Payer: Self-pay | Admitting: Oncology

## 2011-09-24 ENCOUNTER — Encounter (HOSPITAL_BASED_OUTPATIENT_CLINIC_OR_DEPARTMENT_OTHER): Payer: BC Managed Care – PPO | Admitting: Oncology

## 2011-09-24 DIAGNOSIS — C8319 Mantle cell lymphoma, extranodal and solid organ sites: Secondary | ICD-10-CM

## 2011-09-24 DIAGNOSIS — Z5112 Encounter for antineoplastic immunotherapy: Secondary | ICD-10-CM

## 2011-09-24 LAB — CBC WITH DIFFERENTIAL/PLATELET
EOS%: 2.1 % (ref 0.0–7.0)
MCH: 34 pg — ABNORMAL HIGH (ref 27.2–33.4)
MCHC: 33.9 g/dL (ref 32.0–36.0)
MCV: 100.5 fL — ABNORMAL HIGH (ref 79.3–98.0)
MONO%: 11.7 % (ref 0.0–14.0)
RBC: 2.93 10*6/uL — ABNORMAL LOW (ref 4.20–5.82)
RDW: 14.9 % — ABNORMAL HIGH (ref 11.0–14.6)

## 2011-09-24 LAB — TECHNOLOGIST REVIEW

## 2011-09-26 ENCOUNTER — Other Ambulatory Visit: Payer: Self-pay | Admitting: Oncology

## 2011-09-26 ENCOUNTER — Encounter (HOSPITAL_BASED_OUTPATIENT_CLINIC_OR_DEPARTMENT_OTHER): Payer: BC Managed Care – PPO | Admitting: Oncology

## 2011-09-26 DIAGNOSIS — C8319 Mantle cell lymphoma, extranodal and solid organ sites: Secondary | ICD-10-CM

## 2011-09-26 LAB — CBC WITH DIFFERENTIAL/PLATELET
BASO%: 0.2 % (ref 0.0–2.0)
EOS%: 1.3 % (ref 0.0–7.0)
LYMPH%: 12.3 % — ABNORMAL LOW (ref 14.0–49.0)
MCH: 33.7 pg — ABNORMAL HIGH (ref 27.2–33.4)
MCHC: 33.9 g/dL (ref 32.0–36.0)
MONO#: 0.6 10*3/uL (ref 0.1–0.9)
RBC: 2.95 10*6/uL — ABNORMAL LOW (ref 4.20–5.82)
WBC: 4.8 10*3/uL (ref 4.0–10.3)
lymph#: 0.6 10*3/uL — ABNORMAL LOW (ref 0.9–3.3)

## 2011-09-26 LAB — COMPREHENSIVE METABOLIC PANEL
ALT: 15 U/L (ref 0–53)
AST: 22 U/L (ref 0–37)
CO2: 22 mEq/L (ref 19–32)
Sodium: 140 mEq/L (ref 135–145)
Total Bilirubin: 1.1 mg/dL (ref 0.3–1.2)
Total Protein: 6.1 g/dL (ref 6.0–8.3)

## 2011-09-29 IMAGING — PT NM PET TUM IMG RESTAG (PS) SKULL BASE T - THIGH
6 series · 25 of 25 positions shown · non-contrast
Comparison: 01/06/2011.

CLINICAL DATA: Subsequent treatment strategy for restaging of non-
Hodgkins lymphoma.  Chemotherapy and radiation therapy 6 weeks ago.
Stem cell transplant [DATE].

NUCLEAR MEDICINE PET CT SKULL BASE TO THIGH
TECHNIQUE: 16.1 mCi F-18 FDG was injected intravenously via the
right AC.  Full-ring PET imaging was performed from the skull base
through the mid-thighs 61  minutes after injection.  CT data was
obtained and used for attenuation correction and anatomic
localization only.  (This was not acquired as a diagnostic CT
examination.)
Fasting Blood Glucose:  104

[Series 1: pet ac · axial · 3.3mm · 4.69mm/px · z∈[-870,+0]mm · 5 of 267 slices shown]
[im 1/267]
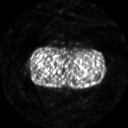
[im 67/267]
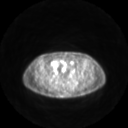
[im 134/267]
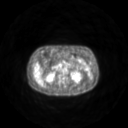
[im 200/267]
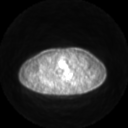
[im 267/267]
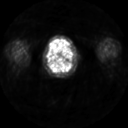

[Series 2: ct images · axial · 3.8mm · 0.98mm/px · z∈[-870,+0]mm · 6 of 263 slices shown]
[im 1/263]
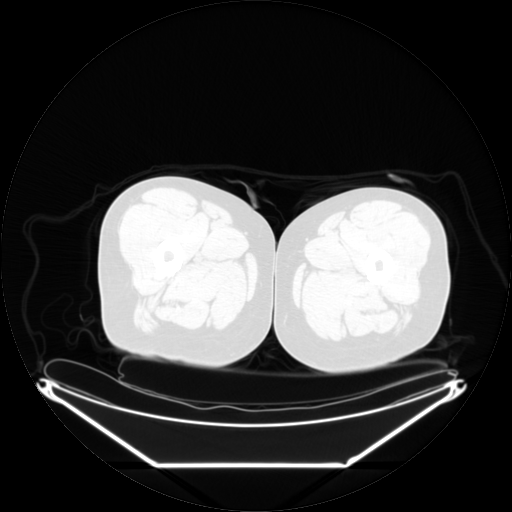
[im 53/263]
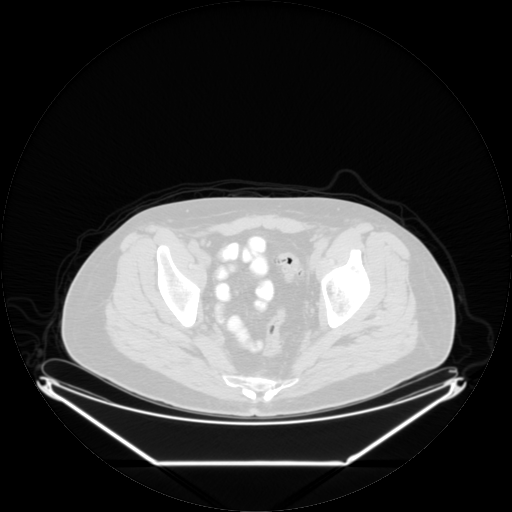
[im 105/263]
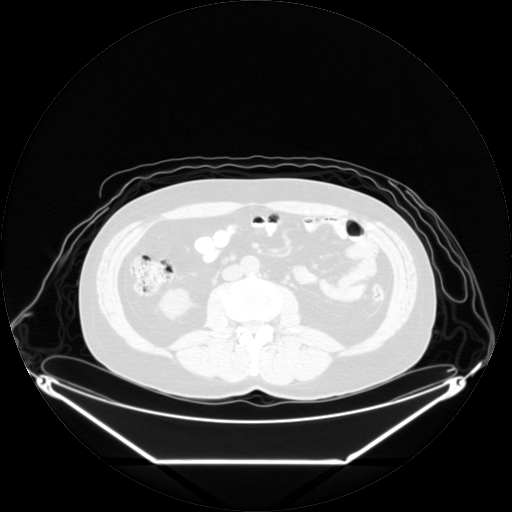
[im 158/263]
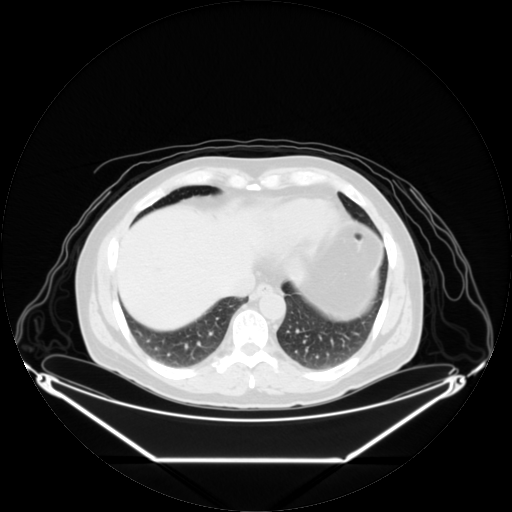
[im 210/263]
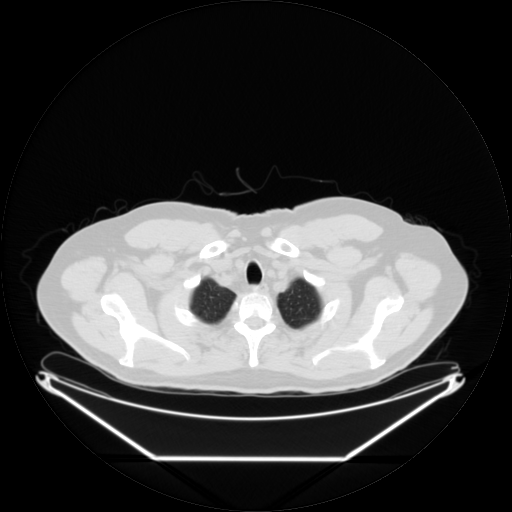
[im 263/263  brain]
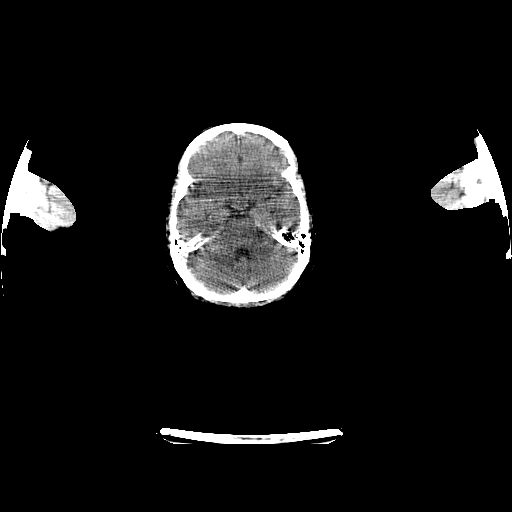

[Series 2: pet nac · axial · 3.3mm · 4.69mm/px · z∈[-870,+0]mm · 6 of 267 slices shown]
[im 1/267]
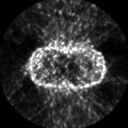
[im 54/267]
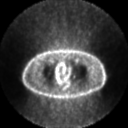
[im 107/267]
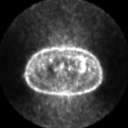
[im 160/267]
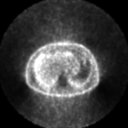
[im 213/267]
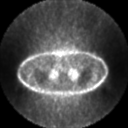
[im 267/267]
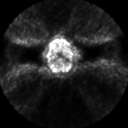

[Series 123: mip · coronal · 3.3mm · 4.69mm/px · 1 of 30 slices shown]
[im 1/30]
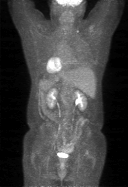

[Series 151: reformatted · axial · 3.3mm · 3.91mm/px · z∈[-870,+0]mm · 6 of 265 slices shown (1 of 2)]
[im 1/265]
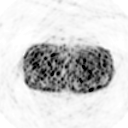
[im 53/265]
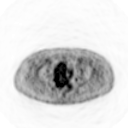
[im 106/265]
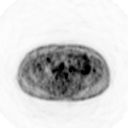
[im 159/265]
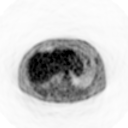
[im 212/265]
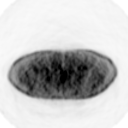
[im 265/265]
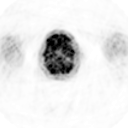

[Series 153: reformatted · coronal · 4.7mm · 6.98mm/px · 1 of 65 slices shown (2 of 2)]
[im 1/65]
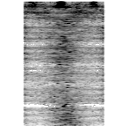

[25 of 25 positions shown; findings below may reference images not displayed]

FINDINGS: PET images demonstrate a focus of hypermetabolism which
corresponds to a surgical clip in the anterior right
supraclavicular subcutaneous fat.  This measures a S.U.V. max of
5.4 on image 46 of series 2.

No abnormal activity within the chest or abdomen.  The
hypermetabolic ileocolic mesenteric node described on the prior
exam is markedly decreased in size and no longer hypermetabolic.
Today, measures 7 mm, within normal limits.  Image 169.  There is
low level hypermetabolism about the right lateralconal fascia in
the region of the cecum.  Likely vascular on image 177.  No
evidence of adenopathy or peritoneal thickening in this area.

CT images performed for attenuation correction demonstrate no
significant findings within the neck.  No thoracic adenopathy.
Extensive colonic diverticulosis.  No pelvic adenopathy.
IMPRESSION: 1.  Response to therapy without evidence of residual or recurrent
hypermetabolic lymphoma.
2.  Hypermetabolism in the anterior subcutaneous fat of the right
supraclavicular region.  This corresponds to a surgical clip and is
presumably procedure-related.

## 2011-10-22 ENCOUNTER — Other Ambulatory Visit: Payer: Self-pay | Admitting: Nurse Practitioner

## 2011-10-22 DIAGNOSIS — C8590 Non-Hodgkin lymphoma, unspecified, unspecified site: Secondary | ICD-10-CM

## 2011-10-23 ENCOUNTER — Telehealth: Payer: Self-pay | Admitting: Oncology

## 2011-10-23 NOTE — Telephone Encounter (Signed)
lmonvm adviising the pt of his pet scan appt on 11/04/2011@8 :30am with instructions

## 2011-11-04 ENCOUNTER — Telehealth: Payer: Self-pay | Admitting: *Deleted

## 2011-11-04 ENCOUNTER — Encounter (HOSPITAL_COMMUNITY): Payer: Self-pay

## 2011-11-04 ENCOUNTER — Encounter (HOSPITAL_COMMUNITY)
Admission: RE | Admit: 2011-11-04 | Discharge: 2011-11-04 | Disposition: A | Discharge status: Home / Self Care | Payer: BC Managed Care – PPO | Source: Ambulatory Visit | Attending: Nurse Practitioner | Admitting: Nurse Practitioner

## 2011-11-04 DIAGNOSIS — C8589 Other specified types of non-Hodgkin lymphoma, extranodal and solid organ sites: Secondary | ICD-10-CM | POA: Insufficient documentation

## 2011-11-04 DIAGNOSIS — C8590 Non-Hodgkin lymphoma, unspecified, unspecified site: Secondary | ICD-10-CM

## 2011-11-04 LAB — GLUCOSE, CAPILLARY: Glucose-Capillary: 85 mg/dL (ref 70–99)

## 2011-11-04 MED ORDER — FLUDEOXYGLUCOSE F - 18 (FDG) INJECTION
21.0000 | Freq: Once | INTRAVENOUS | Status: AC | PRN
  Administered 2011-11-04: 21 via INTRAVENOUS

## 2011-11-04 NOTE — Telephone Encounter (Signed)
Patient called asking for PET scan results--going out of town and "doesn't want to have to worry about the results". MD notified.

## 2011-11-05 NOTE — Telephone Encounter (Signed)
Patient has called again asking for results of PET scan--MD notified. Still on his desk for review.

## 2011-11-10 ENCOUNTER — Telehealth: Payer: Self-pay | Admitting: *Deleted

## 2011-11-10 NOTE — Telephone Encounter (Signed)
Made patient aware that Dr. Yancey Flemings plans colonoscopy 11/27/11, but will move up sooner if he has cancellation. He has nurse visit on 11/17/11. Copy of PET routed to Dr. Greggory Stallion at [facility].

## 2011-11-13 ENCOUNTER — Other Ambulatory Visit: Payer: Self-pay | Admitting: *Deleted

## 2011-11-14 ENCOUNTER — Telehealth: Payer: Self-pay | Admitting: *Deleted

## 2011-11-14 ENCOUNTER — Other Ambulatory Visit: Payer: Self-pay | Admitting: Nurse Practitioner

## 2011-11-14 NOTE — Telephone Encounter (Signed)
Patient called to report that Dr. Greggory Stallion told him to keep Rituxan as scheduled previously on week of 12/3. Dr. Truett Perna was told by Dr. Greggory Stallion to cancel the Rituxan. Dr. Truett Perna will clarify this next week with Dr. Greggory Stallion. Infusion appointment has been cancelled at this time.

## 2011-11-15 ENCOUNTER — Telehealth: Payer: Self-pay | Admitting: Oncology

## 2011-11-15 NOTE — Telephone Encounter (Signed)
Sent the message to the md regarding needing an appt time for this pt for the week of 11/24/2011

## 2011-11-17 ENCOUNTER — Ambulatory Visit (AMBULATORY_SURGERY_CENTER): Payer: BC Managed Care – PPO | Admitting: *Deleted

## 2011-11-17 ENCOUNTER — Telehealth: Payer: Self-pay | Admitting: *Deleted

## 2011-11-17 VITALS — Ht 70.5 in | Wt 178.6 lb

## 2011-11-17 DIAGNOSIS — Z1211 Encounter for screening for malignant neoplasm of colon: Secondary | ICD-10-CM

## 2011-11-17 DIAGNOSIS — C8313 Mantle cell lymphoma, intra-abdominal lymph nodes: Secondary | ICD-10-CM

## 2011-11-17 MED ORDER — PEG-KCL-NACL-NASULF-NA ASC-C 100 G PO SOLR: 1.0000 | Freq: Once | ORAL | Status: AC

## 2011-11-17 NOTE — Telephone Encounter (Signed)
Called patient back and made him aware that his appointment for Rituxan was cancelled and not rescheduled since Dr. Frutoso Chase did not get back in touch with our office about the change in decision regarding holding Rituxan infusion. Patient expressed his displeasure with lack of communication between [facility] and [facility] and that we do not trust him enough to "take his word for it" about the change in plan. Made him aware that when it involves chemo and labs, which have impact on his safety and billing issues MD needs to hear from provider. Informed patient that I will call Gardiner Coins at [facility].

## 2011-11-17 NOTE — Telephone Encounter (Signed)
Patient left VM asking to add testosterone level and Vit B3 level to labs being done tomorrow prior to his Rituxan.

## 2011-11-18 ENCOUNTER — Other Ambulatory Visit: Payer: BC Managed Care – PPO | Admitting: Lab

## 2011-11-18 ENCOUNTER — Ambulatory Visit: Payer: BC Managed Care – PPO

## 2011-11-18 ENCOUNTER — Ambulatory Visit: Payer: BC Managed Care – PPO | Admitting: Oncology

## 2011-11-18 NOTE — Telephone Encounter (Signed)
Spoke with Gardiner Coins at [facility]: She does recall Dr. Greggory Stallion saying it would be OK to proceed with Rituxan, but timing is not urgent. She was not aware of any lab work he suggested. She will ask him and call back tomorrow.

## 2011-11-19 ENCOUNTER — Encounter: Payer: Self-pay | Admitting: Internal Medicine

## 2011-11-19 ENCOUNTER — Ambulatory Visit (AMBULATORY_SURGERY_CENTER): Payer: BC Managed Care – PPO | Admitting: Internal Medicine

## 2011-11-19 VITALS — BP 132/91 | HR 62 | Temp 98.0°F | Resp 12 | Ht 70.5 in | Wt 178.0 lb

## 2011-11-19 DIAGNOSIS — Z1211 Encounter for screening for malignant neoplasm of colon: Secondary | ICD-10-CM

## 2011-11-19 DIAGNOSIS — C8313 Mantle cell lymphoma, intra-abdominal lymph nodes: Secondary | ICD-10-CM

## 2011-11-19 DIAGNOSIS — R933 Abnormal findings on diagnostic imaging of other parts of digestive tract: Secondary | ICD-10-CM

## 2011-11-19 MED ORDER — SODIUM CHLORIDE 0.9 % IV SOLN: 500.0000 mL | INTRAVENOUS | Status: DC

## 2011-11-19 NOTE — Op Note (Signed)
[facility] [address] [place], [place]  [address]  COLONOSCOPY PROCEDURE REPORT  PATIENT:  Mark [name], Mark [name]  MR#:  [id number] BIRTHDATE:  [birth year], 60 yrs. old  GENDER:  male ENDOSCOPIST:  Wilhemina Bonito. Eda Keys, MD REF. BY:  Lavada Mesi. Truett Perna, M.D. PROCEDURE DATE:  11/19/2011 PROCEDURE:  Colonoscopy with biopsies ASA CLASS:  Class II INDICATIONS:  Abnormal PET/CT of abdomen ; +PET in area of cecum (Hx Mantle Cell CA dx 10-2010) MEDICATIONS:   Fentanyl 75 mcg IV, Versed 9 mg IV, These medications were titrated to patient response per physician's verbal order  DESCRIPTION OF PROCEDURE:   After the risks benefits and alternatives of the procedure were thoroughly explained, informed consent was obtained.  Digital rectal exam was performed and revealed no abnormalities.   The LB CF-[id number] endoscope was introduced through the anus and advanced to the cecum, which was identified by both the appendix and ileocecal valve, without limitations.  The quality of the prep was excellent, using MoviPrep.  The instrument was then slowly withdrawn as the colon was fully examined. <<PROCEDUREIMAGES>>  FINDINGS:  A noninflammed nodular area of uncertain significance was found terminal ileum. This looked differrent (better) than previous exam.Multiple bx taken.  Moderate diverticulosis was found in the left colon.  Otherwise normal colonoscopy without polyps, masses, vascular ectasias, or inflammatory changes. Retroflexed views in the rectum revealed no abnormalities.    The time to cecum = 2:12  minutes. The scope was then withdrawn in 10:37  minutes from the cecum and the procedure completed.  COMPLICATIONS:  None  ENDOSCOPIC IMPRESSION: 1) Nodular in the terminal ileum as described 2) Moderate diverticulosis in the left colon 3) Otherwise normal colonoscopy  RECOMMENDATIONS: 1) Await biopsy results  ______________________________ Wilhemina Bonito. Eda Keys, MD  CC:  Rolm Baptise, MD;  Robert Bellow, MD;  The Patient  n. eSIGNED:   Wilhemina Bonito. Eda Keys at 11/19/2011 04:18 PM  Tana Felts, [phone]

## 2011-11-19 NOTE — Patient Instructions (Signed)
Please review discharge instructions (blue and green sheets)  Await pathology results  Resume your normal medications

## 2011-11-19 NOTE — Telephone Encounter (Signed)
Left message requesting update on Rituxan and lab work per Dr. Greggory Stallion.

## 2011-11-19 NOTE — Progress Notes (Signed)
Patient did not experience any of the following events: a burn prior to discharge; a fall within the facility; wrong site/side/patient/procedure/implant event; or a hospital transfer or hospital admission upon discharge from the facility. (G8907) Patient did not have preoperative order for IV antibiotic SSI prophylaxis. (G8918)  

## 2011-11-20 ENCOUNTER — Telehealth: Payer: Self-pay | Admitting: *Deleted

## 2011-11-20 ENCOUNTER — Other Ambulatory Visit: Payer: Self-pay | Admitting: *Deleted

## 2011-11-20 ENCOUNTER — Telehealth: Payer: Self-pay | Admitting: Oncology

## 2011-11-20 NOTE — Telephone Encounter (Signed)

## 2011-11-20 NOTE — Telephone Encounter (Signed)
lmonvm adviisng the pt of his appt with dr Truett Perna on 11/24/2011@4 :30pm

## 2011-11-20 NOTE — Telephone Encounter (Signed)
Message from [facility] with Dr. Greggory Stallion. She reports Dr. Greggory Stallion thinks it's OK to wait for colonoscopy path. It would also be OK to go ahead with Rituxan. Also OK to draw testosterone and Vit D level.  Called pt with this information. He would like to begin Rituxan before the end of the year. Pt understands schedulers will call him with office appt for next week.

## 2011-11-24 ENCOUNTER — Telehealth: Payer: Self-pay | Admitting: Oncology

## 2011-11-24 ENCOUNTER — Ambulatory Visit (HOSPITAL_BASED_OUTPATIENT_CLINIC_OR_DEPARTMENT_OTHER): Payer: BC Managed Care – PPO | Admitting: Oncology

## 2011-11-24 VITALS — BP 122/75 | HR 76 | Temp 96.3°F | Ht 70.5 in | Wt 179.9 lb

## 2011-11-24 DIAGNOSIS — C8313 Mantle cell lymphoma, intra-abdominal lymph nodes: Secondary | ICD-10-CM

## 2011-11-24 DIAGNOSIS — J3489 Other specified disorders of nose and nasal sinuses: Secondary | ICD-10-CM

## 2011-11-24 DIAGNOSIS — C8319 Mantle cell lymphoma, extranodal and solid organ sites: Secondary | ICD-10-CM

## 2011-11-24 NOTE — Progress Notes (Signed)
OFFICE PROGRESS NOTE   INTERVAL HISTORY:   Mark [name] returns as scheduled. He completed 4 weeks of maintenance rituximab therapy beginning in August. He tolerated the rituximab well. He developed a skin infection at the nose in October and was admitted to [facility] (we do not have these records available today). He reports his white count was low and he received growth factor support. The nose infection improved following 2 courses of antibiotic therapy, most recently Keflex. He underwent a restaging PET scan on November 7. This revealed hypermetabolic activity at the cecum. Mild bowel wall thickening was noted in this region. A 10 mm ileocecal lymph node was not hypermetabolic. No hypermetabolic activity in the neck, chest, or bones. He was referred to Dr. Marina Goodell and underwent a colonoscopy on December 5. A noninflamed nodular area of uncertain significance was noted at the terminal ileum. Multiple biopsies were obtained and revealed no evidence of mantle cell lymphoma. Diverticulosis was noted in the left colon. The colonoscopy was otherwise without polyps, masses, or inflammatory changes.  He reports feeling well. He denies fever, night sweats, and anorexia.   Objective:  Vital signs in last 24 hours:  Blood pressure 122/75, pulse 76, temperature 96.3 F (35.7 C), temperature source Oral, height 5' 10.5" (1.791 m), weight 179 lb 14.4 oz (81.602 kg).    HEENT: Neck without mass. There is mild induration at the right side of the lower nose and surrounding the inferior nasal septum. There are calcifications over the right lower nose. Mild whitecoat over the tongue. No buccal thrush. Lymphatics: No cervical, supraclavicular, axillary, or inguinal lymph node Resp: Lungs clear bilateral Cardio: Regular rate and rhythm GI: The abdomen is nontender and without mass. No hepatosplenomegaly Vascular: No leg edema     Lab Results:  Lab Results  Component Value Date   WBC 4.8 09/26/2011   HGB 9.9* 09/26/2011   HCT 29.3* 09/26/2011   MCV 99.3* 09/26/2011   PLT 144 09/26/2011      Medications: I have reviewed the patient's current medications.  Assessment/Plan: 1. 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  He completed 2 cycles of R-EPOCH chemotherapy with cycle 2 initiated 12/18/2010.  Restaging CT scan on 01/06/2011 revealed a marked decrease in the hypermetabolic mass at the cecum and resolution of hypermetabolic activity and small pelvic lymph nodes.  There was remaining hypermetabolic activity with a soft-tissue component at the ileocecal region.  He completed a third cycle of R-EPOCH beginning 01/21/2011.  He was admitted to [facility] on 02/25/2011 and received etoposide/cytarabine and Rituximab.  He was readmitted to [facility] and underwent high-dose chemotherapy followed by autologous stem cell support with a stem cell infusion given on 04/24/2011.       -He completed 4 weeks of maintenance rituximab beginning on 08/04/2011.          -restaging PET scan 11/04/2011 revealed hypermetabolic activity at the cecum with mild bowel wall thickening      -colonoscopy 11/19/2011 revealed a noninflamed nodular area of uncertain significance at the terminal ileum and a biopsy showed no evidence of mantle cell lymphoma 2. History of mucositis secondary to chemotherapy, resolved. 3. History of pancytopenia secondary to chemotherapy. 4. Status post placement of apheresis catheter 01/20/2011.  Status post removal of a retained catheter cuff on 06/26/2011. 5. Intermittent fever, 01/19/2011, resolved.  Question related to toxicity from high-dose chemotherapy or infectious process. 6. Malaise - improved.   7.  Anorexia/weight loss - improved.  8. Skin infection at the right side of the nose requiring hospital admission at [facility] in October of 2012-improved following antibiotic  therapy   Disposition:  Mark [name] appears well. The colonoscopy did not reveal evidence of progressive lymphoma. I discussed the case with Dr. Greggory Stallion. The plan is to continue every three-month maintenance rituximab. He'll be scheduled for a dose of rituximab this week. He'll return for an office visit and rituximab in 3 months.  We will check a CBC when he returns later this week. He knows to contact us for increased erythema at the nose.   Lucile Shutters, MD  11/24/2011  5:28 PM

## 2011-11-24 NOTE — Telephone Encounter (Signed)
gve the pt his march 2013 appt. Pt is aware we will contact him with the dec appt

## 2011-11-27 ENCOUNTER — Other Ambulatory Visit: Payer: BC Managed Care – PPO | Admitting: Internal Medicine

## 2011-11-30 ENCOUNTER — Other Ambulatory Visit: Payer: Self-pay | Admitting: Oncology

## 2011-12-01 ENCOUNTER — Ambulatory Visit (HOSPITAL_BASED_OUTPATIENT_CLINIC_OR_DEPARTMENT_OTHER): Payer: BC Managed Care – PPO

## 2011-12-01 ENCOUNTER — Other Ambulatory Visit (HOSPITAL_BASED_OUTPATIENT_CLINIC_OR_DEPARTMENT_OTHER): Payer: BC Managed Care – PPO | Admitting: Lab

## 2011-12-01 VITALS — BP 124/83 | HR 64 | Temp 97.5°F

## 2011-12-01 DIAGNOSIS — C8313 Mantle cell lymphoma, intra-abdominal lymph nodes: Secondary | ICD-10-CM

## 2011-12-01 DIAGNOSIS — Z5112 Encounter for antineoplastic immunotherapy: Secondary | ICD-10-CM

## 2011-12-01 DIAGNOSIS — C8318 Mantle cell lymphoma, lymph nodes of multiple sites: Secondary | ICD-10-CM

## 2011-12-01 LAB — CBC WITH DIFFERENTIAL/PLATELET
BASO%: 0.2 % (ref 0.0–2.0)
Eosinophils Absolute: 0.1 10*3/uL (ref 0.0–0.5)
HCT: 35.2 % — ABNORMAL LOW (ref 38.4–49.9)
MCHC: 34.4 g/dL (ref 32.0–36.0)
MONO#: 0.3 10*3/uL (ref 0.1–0.9)
NEUT#: 3.3 10*3/uL (ref 1.5–6.5)
NEUT%: 74.4 % (ref 39.0–75.0)
RBC: 3.61 10*6/uL — ABNORMAL LOW (ref 4.20–5.82)
WBC: 4.4 10*3/uL (ref 4.0–10.3)
lymph#: 0.6 10*3/uL — ABNORMAL LOW (ref 0.9–3.3)
nRBC: 0 % (ref 0–0)

## 2011-12-01 LAB — TESTOSTERONE: Testosterone: 239.18 ng/dL — ABNORMAL LOW (ref 250–890)

## 2011-12-01 MED ORDER — SODIUM CHLORIDE 0.9 % IV SOLN: 375.0000 mg/m2 | Freq: Once | INTRAVENOUS | Status: DC

## 2011-12-01 MED ORDER — ACETAMINOPHEN 325 MG PO TABS
650.0000 mg | ORAL_TABLET | Freq: Once | ORAL | Status: AC
  Administered 2011-12-01: 650 mg via ORAL

## 2011-12-01 MED ORDER — SODIUM CHLORIDE 0.9 % IV SOLN
Freq: Once | INTRAVENOUS | Status: AC
  Administered 2011-12-01: 10:00:00 via INTRAVENOUS

## 2011-12-01 MED ORDER — SODIUM CHLORIDE 0.9 % IV SOLN
375.0000 mg/m2 | Freq: Once | INTRAVENOUS | Status: AC
  Administered 2011-12-01: 800 mg via INTRAVENOUS
  Filled 2011-12-01: qty 80

## 2011-12-01 MED ORDER — DIPHENHYDRAMINE HCL 25 MG PO CAPS
50.0000 mg | ORAL_CAPSULE | Freq: Once | ORAL | Status: AC
  Administered 2011-12-01: 50 mg via ORAL

## 2011-12-03 ENCOUNTER — Telehealth: Payer: Self-pay | Admitting: *Deleted

## 2011-12-03 NOTE — Telephone Encounter (Signed)
Called pt with lab results, he requests copy to be sent to Dr. Greggory Stallion and Dr. Perrin Maltese. Sent to HIM to fax.

## 2011-12-03 NOTE — Telephone Encounter (Signed)
Message copied by Caleb Popp on Wed Dec 03, 2011  3:50 PM ------      Message from: Thornton Papas B      Created: Mon Dec 01, 2011  8:16 PM       Please call patient, vit D is normal, testosterone is mildly decreased.  Can f/u with primary MD to discuss

## 2011-12-15 ENCOUNTER — Telehealth: Payer: Self-pay | Admitting: *Deleted

## 2011-12-15 NOTE — Telephone Encounter (Signed)
Forwarded copy of labs to Dr. Greggory Stallion and Dr. Robert Bellow. Copy in mail to patient home.

## 2011-12-19 ENCOUNTER — Telehealth: Payer: Self-pay | Admitting: *Deleted

## 2011-12-19 NOTE — Telephone Encounter (Signed)
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 Needs office notes and labs and letter from MD that he is OK to fly and in remission from his lymphoma standpoint. Asking if it needs to be emailed, mailed or does he need to pick up? MD notified of request.

## 2012-01-06 NOTE — Letter (Signed)
January 05, 2012    To Whom it May Concern  NAME:  Mark, [name] MRN:  [id number] DOB:  06/26/1951  [name] (date of birth [birth year]) is a patient at the [facility].  [name] has a history of non-Hodgkin lymphoma.  He is currently in clinical remission.  He continues "maintenance therapy" with rituximab every 3 months.  Please contact me with any question regarding the diagnosis of lymphoma.  Sincerely,    Ladene Artist, M.D.  GBS/MEDQ  D:  01/05/2012  T:  01/05/2012  Job:  [id number]

## 2012-01-14 ENCOUNTER — Encounter: Payer: Self-pay | Admitting: *Deleted

## 2012-01-14 NOTE — Progress Notes (Signed)
Letter requested for his work printed and mailed to home.

## 2012-02-16 ENCOUNTER — Ambulatory Visit (HOSPITAL_BASED_OUTPATIENT_CLINIC_OR_DEPARTMENT_OTHER): Payer: BC Managed Care – PPO | Admitting: Lab

## 2012-02-16 ENCOUNTER — Telehealth: Payer: Self-pay | Admitting: Oncology

## 2012-02-16 ENCOUNTER — Telehealth: Payer: Self-pay | Admitting: *Deleted

## 2012-02-16 ENCOUNTER — Other Ambulatory Visit: Payer: Self-pay | Admitting: *Deleted

## 2012-02-16 DIAGNOSIS — C8313 Mantle cell lymphoma, intra-abdominal lymph nodes: Secondary | ICD-10-CM

## 2012-02-16 LAB — CBC WITH DIFFERENTIAL/PLATELET
Basophils Absolute: 0 10*3/uL (ref 0.0–0.1)
Eosinophils Absolute: 0.2 10*3/uL (ref 0.0–0.5)
HCT: 34.9 % — ABNORMAL LOW (ref 38.4–49.9)
HGB: 12 g/dL — ABNORMAL LOW (ref 13.0–17.1)
MCH: 34 pg — ABNORMAL HIGH (ref 27.2–33.4)
MCV: 98.8 fL — ABNORMAL HIGH (ref 79.3–98.0)
NEUT#: 3.7 10*3/uL (ref 1.5–6.5)
NEUT%: 75.7 % — ABNORMAL HIGH (ref 39.0–75.0)
RDW: 14.8 % — ABNORMAL HIGH (ref 11.0–14.6)
lymph#: 0.5 10*3/uL — ABNORMAL LOW (ref 0.9–3.3)

## 2012-02-16 NOTE — Telephone Encounter (Signed)
Reports approximately 3 day history of some "bright red oxygenated blood" from his rectum with BM and wiping. Does admit his stool is slightly more firm than usual. No rectal pain is noted. Had call Dr. Cyndie Chime over the weekend about this. Suggested he use baby wipes and OTC  Hemorrhoid preparation and try to get stools softer. Suggested he have his PCP or Dr. Marina Goodell (GI) see him to assess. He is asking first to have his counts checked, thinking his platelets are low. Told him that would be reasonable request. Will have scheduler call him with appointment.

## 2012-02-16 NOTE — Telephone Encounter (Signed)
called pt and scheduled a lab appt for today @ 1pm

## 2012-02-22 ENCOUNTER — Other Ambulatory Visit: Payer: Self-pay | Admitting: Oncology

## 2012-02-24 ENCOUNTER — Other Ambulatory Visit: Payer: Self-pay | Admitting: Oncology

## 2012-02-24 ENCOUNTER — Ambulatory Visit (HOSPITAL_BASED_OUTPATIENT_CLINIC_OR_DEPARTMENT_OTHER): Payer: BC Managed Care – PPO

## 2012-02-24 ENCOUNTER — Telehealth: Payer: Self-pay | Admitting: Oncology

## 2012-02-24 ENCOUNTER — Other Ambulatory Visit (HOSPITAL_BASED_OUTPATIENT_CLINIC_OR_DEPARTMENT_OTHER): Payer: BC Managed Care – PPO | Admitting: Lab

## 2012-02-24 ENCOUNTER — Ambulatory Visit (HOSPITAL_BASED_OUTPATIENT_CLINIC_OR_DEPARTMENT_OTHER): Payer: BC Managed Care – PPO | Admitting: Oncology

## 2012-02-24 VITALS — BP 121/76 | HR 64 | Temp 97.8°F

## 2012-02-24 VITALS — BP 108/65 | HR 85 | Temp 97.3°F | Ht 70.5 in | Wt 183.2 lb

## 2012-02-24 DIAGNOSIS — C8313 Mantle cell lymphoma, intra-abdominal lymph nodes: Secondary | ICD-10-CM

## 2012-02-24 DIAGNOSIS — Z5112 Encounter for antineoplastic immunotherapy: Secondary | ICD-10-CM

## 2012-02-24 LAB — CBC WITH DIFFERENTIAL/PLATELET
Basophils Absolute: 0 10*3/uL (ref 0.0–0.1)
EOS%: 2.7 % (ref 0.0–7.0)
Eosinophils Absolute: 0.2 10*3/uL (ref 0.0–0.5)
HGB: 11.6 g/dL — ABNORMAL LOW (ref 13.0–17.1)
LYMPH%: 8 % — ABNORMAL LOW (ref 14.0–49.0)
MCH: 33 pg (ref 27.2–33.4)
MCV: 96 fL (ref 79.3–98.0)
MONO%: 6 % (ref 0.0–14.0)
NEUT#: 6.6 10*3/uL — ABNORMAL HIGH (ref 1.5–6.5)
NEUT%: 83.2 % — ABNORMAL HIGH (ref 39.0–75.0)
Platelets: 143 10*3/uL (ref 140–400)

## 2012-02-24 MED ORDER — SODIUM CHLORIDE 0.9 % IV SOLN
Freq: Once | INTRAVENOUS | Status: AC
  Administered 2012-02-24: 11:00:00 via INTRAVENOUS

## 2012-02-24 MED ORDER — DIPHENHYDRAMINE HCL 25 MG PO CAPS
50.0000 mg | ORAL_CAPSULE | Freq: Once | ORAL | Status: AC
  Administered 2012-02-24: 50 mg via ORAL

## 2012-02-24 MED ORDER — SODIUM CHLORIDE 0.9 % IV SOLN
800.0000 mg | Freq: Once | INTRAVENOUS | Status: AC
  Administered 2012-02-24: 800 mg via INTRAVENOUS
  Filled 2012-02-24: qty 80

## 2012-02-24 MED ORDER — SODIUM CHLORIDE 0.9 % IV SOLN: 375.0000 mg/m2 | Freq: Once | INTRAVENOUS | Status: DC

## 2012-02-24 MED ORDER — ACETAMINOPHEN 325 MG PO TABS
650.0000 mg | ORAL_TABLET | Freq: Once | ORAL | Status: AC
  Administered 2012-02-24: 650 mg via ORAL

## 2012-02-24 NOTE — Progress Notes (Signed)
OFFICE PROGRESS NOTE   INTERVAL HISTORY:   He returns as scheduled. He tolerated the last treatment with Rituxan well. He is exercising regularly.  The right side of the nose continues to be red and there is an intermittent drainage. He denies fever. He denies night sweats and abdominal pain. He reports a few recent episodes of rectal bleeding after a firm bowel movement.  Objective:  Vital signs in last 24 hours:  Blood pressure 108/65, pulse 85, temperature 97.3 F (36.3 C), temperature source Oral, height 5' 10.5" (1.791 m), weight 183 lb 3.2 oz (83.099 kg).    HEENT: There is erythema at the distal aspect of the right nose/nostril. Small amount of. Let discharge at the distal/medial right nostril. Neck without mass. Lymphatics: No cervical, supraclavicular, axillary, or inguinal nodes Resp: Lungs clear bilaterally Cardio: Regular rate and rhythm GI: No hepatosplenic, no mass, nontender Vascular: No leg edema      Lab Results:  Lab Results  Component Value Date   WBC 7.9 02/24/2012   HGB 11.6* 02/24/2012   HCT 33.7* 02/24/2012   MCV 96.0 02/24/2012   PLT 143 02/24/2012   ANC 6.6    Medications: I have reviewed the patient's current medications.  Assessment/Plan: 1. 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 He completed 2 cycles of R-EPOCH chemotherapy with cycle 2 initiated 12/18/2010. Restaging CT scan on 01/06/2011 revealed a marked decrease in the hypermetabolic mass at the cecum and resolution of hypermetabolic activity and small pelvic lymph nodes. There was remaining hypermetabolic activity with a soft-tissue component at the ileocecal region. He completed a third cycle of R-EPOCH beginning 01/21/2011. He was admitted to [facility] on 02/25/2011 and received etoposide/cytarabine and Rituximab. He was readmitted to [facility] and underwent high-dose chemotherapy followed by autologous  stem cell support with a stem cell infusion given on 04/24/2011. -He completed 4 weeks of maintenance rituximab beginning on 08/04/2011. -restaging PET scan 11/04/2011 revealed hypermetabolic activity at the cecum with mild bowel wall thickening -colonoscopy 11/19/2011 revealed a noninflamed nodular area of uncertain significance at the terminal ileum and a biopsy showed no evidence of mantle cell lymphoma. He began every three-month maintenance Rituxan 12/01/2011. 2. History of mucositis secondary to chemotherapy, resolved. 3. History of pancytopenia secondary to chemotherapy. 4. Status post placement of apheresis catheter 01/20/2011. Status post removal of a retained catheter cuff on 06/26/2011. 5. Intermittent fever, 01/19/2011, resolved. Question related to toxicity from high-dose chemotherapy or infectious process. 6. Malaise - improved.  7. Anorexia/weight loss - improved.  8. Skin infection at the right side of the nose requiring hospital admission at [facility] in October of 2012-improved following antibiotic therapy. There is persistent erythema with a present discharge at the distal right nose/nostril. We cultured this area today  Disposition:  He remains in clinical remission from the non-Hodgkin's lymphoma. The plan is to continue every three-month rituximab.  We cultured the purulent area at the distal right nostril today. We will followup on the culture results and prescribed antibiotic therapy as indicated.     Lucile Shutters, MD  02/24/2012  6:00 PM

## 2012-02-24 NOTE — Telephone Encounter (Signed)
gv pt appt schedule for June.  °

## 2012-02-27 ENCOUNTER — Telehealth: Payer: Self-pay | Admitting: *Deleted

## 2012-02-27 NOTE — Telephone Encounter (Signed)
Call from pt to follow up on nose culture results. Not yet resulted, informed him nurse will call with results. Checked with lab technicians who state wound culture is likely to take 5 days to result.

## 2012-03-01 ENCOUNTER — Telehealth: Payer: Self-pay | Admitting: *Deleted

## 2012-03-01 DIAGNOSIS — B958 Unspecified staphylococcus as the cause of diseases classified elsewhere: Secondary | ICD-10-CM

## 2012-03-01 MED ORDER — AMOXICILLIN 500 MG PO CAPS: 500.0000 mg | ORAL_CAPSULE | Freq: Three times a day (TID) | ORAL | Status: DC

## 2012-03-01 NOTE — Telephone Encounter (Signed)
Nasal swab returned streptococcus Group G and abundant Staphylococcus aureus. E-scribed Amoxicillin 500 mg tid X 7 days to [facility]. Patient notified. Will call if not better after he completes course.

## 2012-04-23 ENCOUNTER — Telehealth: Payer: Self-pay | Admitting: *Deleted

## 2012-04-23 NOTE — Telephone Encounter (Signed)
Message from pt reporting he had his one year follow up at [facility]. They are recommending PET in the next couple weeks. Just wanted to make Dr. Truett Perna aware.

## 2012-04-28 ENCOUNTER — Other Ambulatory Visit: Payer: Self-pay | Admitting: *Deleted

## 2012-04-28 ENCOUNTER — Telehealth: Payer: Self-pay | Admitting: Oncology

## 2012-04-28 DIAGNOSIS — C8313 Mantle cell lymphoma, intra-abdominal lymph nodes: Secondary | ICD-10-CM

## 2012-04-28 NOTE — Progress Notes (Signed)
Confirmation from progress note of Dr. Greggory Stallion to do PET scan in next month. IF returns negative, this will be his last surveillance imaging.

## 2012-04-28 NOTE — Telephone Encounter (Signed)
lmonvm adviisng the pt of his pet scan appt on 05/07/2012

## 2012-04-30 ENCOUNTER — Telehealth: Payer: Self-pay | Admitting: Oncology

## 2012-04-30 NOTE — Telephone Encounter (Signed)
lmonvm adviisng the pt of his pet scan appt on 05/07/2012@7 :00am

## 2012-05-07 ENCOUNTER — Encounter (HOSPITAL_COMMUNITY)
Admission: RE | Admit: 2012-05-07 | Discharge: 2012-05-07 | Disposition: A | Discharge status: Home / Self Care | Payer: BC Managed Care – PPO | Source: Ambulatory Visit | Attending: Oncology | Admitting: Oncology

## 2012-05-07 ENCOUNTER — Telehealth: Payer: Self-pay | Admitting: *Deleted

## 2012-05-07 DIAGNOSIS — C8313 Mantle cell lymphoma, intra-abdominal lymph nodes: Secondary | ICD-10-CM

## 2012-05-07 LAB — GLUCOSE, CAPILLARY: Glucose-Capillary: 101 mg/dL — ABNORMAL HIGH (ref 70–99)

## 2012-05-07 MED ORDER — FLUDEOXYGLUCOSE F - 18 (FDG) INJECTION
18.9000 | Freq: Once | INTRAVENOUS | Status: AC | PRN
  Administered 2012-05-07: 18.9 via INTRAVENOUS

## 2012-05-07 NOTE — Telephone Encounter (Signed)
Called pt with PET scan results. He reports he has had a cough for 6-8 weeks. Was put on Septra for 7 days with no relief. Pt also reports he was on Clindamycin for infection on his nose a few weeks ago. Denies fever. Reports only occasional dyspnea with exertion.  Dr. Truett Perna discussed case with Dr. Greggory Stallion who recommends pt be admitted. Pt understands to pick up PET on disc before reporting to [facility] for admission.

## 2012-05-07 NOTE — Telephone Encounter (Signed)
Patient had PET scan today and has called Dr. Greggory Stallion with concern about the results. Had report faxed to Dr. Greggory Stallion, but he prefers not to comment on it unless he see the film itself. Mark [name] is anxious and wants to speak with Dr. Truett Perna or see him today if possible.  Made [name] aware that this information will be given to Dr. Truett Perna.

## 2012-05-17 ENCOUNTER — Telehealth: Payer: Self-pay | Admitting: *Deleted

## 2012-05-17 NOTE — Telephone Encounter (Signed)
Called pt to follow up on hospitalization. He reports he was discharged after 2 days. Sent home on Bactrim and Prednisone taper. Pt plans to keep appt 05/18/12. Stated Dr. Greggory Stallion told him to hold off on Rituxan for now.

## 2012-05-18 ENCOUNTER — Ambulatory Visit (HOSPITAL_BASED_OUTPATIENT_CLINIC_OR_DEPARTMENT_OTHER): Payer: BC Managed Care – PPO | Admitting: Oncology

## 2012-05-18 ENCOUNTER — Telehealth: Payer: Self-pay | Admitting: Oncology

## 2012-05-18 ENCOUNTER — Ambulatory Visit: Payer: BC Managed Care – PPO

## 2012-05-18 ENCOUNTER — Other Ambulatory Visit (HOSPITAL_BASED_OUTPATIENT_CLINIC_OR_DEPARTMENT_OTHER): Payer: BC Managed Care – PPO | Admitting: Lab

## 2012-05-18 VITALS — BP 125/69 | HR 86 | Temp 97.1°F | Ht 70.5 in | Wt 173.8 lb

## 2012-05-18 DIAGNOSIS — R5383 Other fatigue: Secondary | ICD-10-CM

## 2012-05-18 DIAGNOSIS — C8589 Other specified types of non-Hodgkin lymphoma, extranodal and solid organ sites: Secondary | ICD-10-CM

## 2012-05-18 DIAGNOSIS — C8313 Mantle cell lymphoma, intra-abdominal lymph nodes: Secondary | ICD-10-CM

## 2012-05-18 LAB — CBC WITH DIFFERENTIAL/PLATELET
BASO%: 0 % (ref 0.0–2.0)
EOS%: 0.6 % (ref 0.0–7.0)
MCH: 31.7 pg (ref 27.2–33.4)
MCHC: 34.7 g/dL (ref 32.0–36.0)
MCV: 91.2 fL (ref 79.3–98.0)
MONO%: 6.5 % (ref 0.0–14.0)
RBC: 3.88 10*6/uL — ABNORMAL LOW (ref 4.20–5.82)
RDW: 15.4 % — ABNORMAL HIGH (ref 11.0–14.6)
lymph#: 1 10*3/uL (ref 0.9–3.3)

## 2012-05-18 NOTE — Progress Notes (Signed)
[facility]    OFFICE PROGRESS NOTE   INTERVAL HISTORY:   He returns as scheduled. He underwent a staging PET scan on may 24th 2013. New symmetric pulmonary groundglass opacities were noted with associated hypermetabolic activity to no hypermetabolic lymph nodes in the chest. No hypermetabolic neck, abdomen, or pelvic nodes. Hypermetabolic activity at the cecum has resolved. The spleen appeared normal. I discussed the PET scan result with Mark [name] by telephone on 05/07/2012. He reported a cough for several weeks prior to the PET scan. He had completed a course of Bactrim prescribed by Dr. Greggory Stallion. I discussed the case with Dr. Greggory Stallion and Mark [name] was referred to [facility] for hospital admission.  I do not have the discharge summary available today. He reports the [facility] physicians initially recommended a bronchoscopy procedure, but decided against this. He was placed on high dose Bactrim therapy and this continues. He is also taking prednisone, currently at a dose of 40 mg daily.  He has noted a significant improvement in the cough and exertional dyspnea over the past 2 weeks. No fever. Prednisone causes insomnia and he believes the Bactrim has caused constipation. Constipation has been relieved with prune juice and a probiotic.  Objective:  Vital signs in last 24 hours:  Blood pressure 125/69, pulse 86, temperature 97.1 F (36.2 C), temperature source Oral, height 5' 10.5" (1.791 m), weight 173 lb 12.8 oz (78.835 kg).    HEENT: Mild whitecoat over the tongue, no buccal thrush Lymphatics: No cervical, supraclavicular, axillary, or inguinal nodes Resp: Clear bilaterally in the anterior and posterior lung fields Cardio: Regular rate and rhythm GI: Nontender, no hepatosplenomegaly, no mass Vascular: No leg edema   Lab Results:  Lab Results  Component Value Date   WBC 11.0* 05/18/2012   HGB 12.3* 05/18/2012   HCT 35.4* 05/18/2012   MCV 91.2 05/18/2012   PLT 186  05/18/2012   ANC 9.3    Medications: I have reviewed the patient's current medications.  Assessment/Plan: 1. 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 He completed 2 cycles of R-EPOCH chemotherapy with cycle 2 initiated 12/18/2010. Restaging CT scan on 01/06/2011 revealed a marked decrease in the hypermetabolic mass at the cecum and resolution of hypermetabolic activity and small pelvic lymph nodes. There was remaining hypermetabolic activity with a soft-tissue component at the ileocecal region. He completed a third cycle of R-EPOCH beginning 01/21/2011. He was admitted to [facility] on 02/25/2011 and received etoposide/cytarabine and Rituximab. He was readmitted to [facility] and underwent high-dose chemotherapy followed by autologous stem cell support with a stem cell infusion given on 04/24/2011. -He completed 4 weeks of maintenance rituximab beginning on 08/04/2011. -restaging PET scan 11/04/2011 revealed hypermetabolic activity at the cecum with mild bowel wall thickening -colonoscopy 11/19/2011 revealed a noninflamed nodular area of uncertain significance at the terminal ileum and a biopsy showed no evidence of mantle cell lymphoma. He began every three-month maintenance Rituxan 12/01/2011, last given 02/24/2012            -restaging PET scan 05/07/12 revealed bilateral hypermetabolic lung infiltrate and no evidence of residual/recurrent lymphoma 2. History of mucositis secondary to chemotherapy, resolved. 3. History of pancytopenia secondary to chemotherapy. 4. Status post placement of apheresis catheter 01/20/2011. Status post removal of a retained catheter cuff on 06/26/2011. 5. Intermittent fever, 01/19/2011, resolved. Question related to toxicity from high-dose chemotherapy or infectious process. 6. Malaise - improved.  7. Anorexia/weight loss - he has lost weight over the past few months Skin infection at the  right side of the nose requiring hospital admission at [facility] in October of 2012-improved following antibiotic therapy. There was persistent erythema with a  discharge at the distal right nose/nostril on 03/122013. A culture was positive for group G. streptococcus and staph aureus. He was treated with amoxicillin         8.  pulmonary infiltrates on the PET scan 05/07/12-likely infectious, currently completing a course of high dose Bactrim therapy and prednisone. There has been clinical improvement.  Disposition:  He remains in clinical remission from the non-Hodgkin's lymphoma. We decided to hold further rituximab therapy given the current pulmonary infiltrates which are most likely infectious.  He will complete the prescribed course of Bactrim and prednisone. He reports a followup visit and chest CT scan has been scheduled with Dr. Greggory Stallion on 05/26/2012. He will return for an office visit here on 06/09/2012. He will contact us in the interim for new symptoms.   Mark Papas, MD  05/18/2012  9:52 AM

## 2012-05-18 NOTE — Telephone Encounter (Signed)
called pt with 6/26 appt with lt   aom

## 2012-05-31 ENCOUNTER — Encounter: Payer: Self-pay | Admitting: Dietician

## 2012-05-31 NOTE — Progress Notes (Signed)
Out-patient Oncology Nutrition Note  Reason: Patient screened positive for nutrition risk for unintentional weight loss and decreased appetite.  Mark [name] is a 61 year old male patient of Dr. Truett Perna, diagnosed with malignant lymphomas. Contact Mr. Yankowski via telephone for positive nutrition risk. Patient reported his appetite and intake are great. He is currently on prednisone. He reported he eats a lot throughout the day but is not gaining weight. He reported that he is not currently undergoing any chemotherapy treatment. He reported he eats healthy.   Wt Readings from Last 10 Encounters:  05/18/12 173 lb 12.8 oz (78.835 kg)  02/24/12 183 lb 3.2 oz (83.099 kg)  11/24/11 179 lb 14.4 oz (81.602 kg)  11/19/11 178 lb (80.74 kg)  11/17/11 178 lb 9.6 oz (81.012 kg)   Patient is doing well nutritionally. Patient is eating frequently throughout the day and reported to be keeping his weight stable at about 171 lb.. I encouraged the patient to continue to eat well and keep weight stable. I instructed the patient to contact RD for future nutrition questions or concerns.     RD available for nutrition needs.  Iven Finn [facility] [phone]

## 2012-06-09 ENCOUNTER — Telehealth: Payer: Self-pay | Admitting: Oncology

## 2012-06-09 ENCOUNTER — Ambulatory Visit (HOSPITAL_BASED_OUTPATIENT_CLINIC_OR_DEPARTMENT_OTHER): Payer: BC Managed Care – PPO | Admitting: Nurse Practitioner

## 2012-06-09 VITALS — BP 104/71 | HR 92 | Temp 98.7°F | Ht 70.5 in | Wt 173.9 lb

## 2012-06-09 DIAGNOSIS — R918 Other nonspecific abnormal finding of lung field: Secondary | ICD-10-CM

## 2012-06-09 DIAGNOSIS — C8313 Mantle cell lymphoma, intra-abdominal lymph nodes: Secondary | ICD-10-CM

## 2012-06-09 DIAGNOSIS — R509 Fever, unspecified: Secondary | ICD-10-CM

## 2012-06-09 NOTE — Telephone Encounter (Signed)
appts made and printed for pt aom °

## 2012-06-09 NOTE — Progress Notes (Signed)
OFFICE PROGRESS NOTE  Interval history:  Mark [name] returns as scheduled. He completed a prednisone taper yesterday. On 06/04/2012 he developed fever to 103 . The following night his temperature increased to 102.6. He has continued to have daily fevers, typically in the evenings, with the most recent occurring on 06/08/2012. He reports being seen at [facility] on 06/07/2012 and having a chest x-ray as well as blood cultures. The chest x-ray was reported to him as improved. He continues Bactrim. Overall his cough is better but not completely resolved. The cough is intermittently productive. He denies shortness of breath   Objective: Blood pressure 104/71, pulse 92, temperature 98.7 F (37.1 C), temperature source Oral, height 5' 10.5" (1.791 m), weight 173 lb 14.4 oz (78.881 kg).  Well appearing man in no acute distress. Oropharynx is without thrush or ulceration. No palpable cervical, supraclavicular, axillary or inguinal lymph nodes. Lungs are clear. No respiratory distress. Regular cardiac rhythm. Abdomen soft and nontender. No organomegaly. Extremities are without edema.  Lab Results: Lab Results  Component Value Date   WBC 11.0* 05/18/2012   HGB 12.3* 05/18/2012   HCT 35.4* 05/18/2012   MCV 91.2 05/18/2012   PLT 186 05/18/2012    Chemistry:    Chemistry      Component Value Date/Time   NA 140 09/26/2011 1339   NA 140 09/26/2011 1339   K 4.4 09/26/2011 1339   K 4.4 09/26/2011 1339   CL 105 09/26/2011 1339   CL 105 09/26/2011 1339   CO2 22 09/26/2011 1339   CO2 22 09/26/2011 1339   BUN 19 09/26/2011 1339   BUN 19 09/26/2011 1339   CREATININE 1.00 09/26/2011 1339   CREATININE 1.00 09/26/2011 1339      Component Value Date/Time   CALCIUM 9.0 09/26/2011 1339   CALCIUM 9.0 09/26/2011 1339   ALKPHOS 55 09/26/2011 1339   ALKPHOS 55 09/26/2011 1339   AST 22 09/26/2011 1339   AST 22 09/26/2011 1339   ALT 15 09/26/2011 1339   ALT 15 09/26/2011 1339   BILITOT 1.1 09/26/2011 1339   BILITOT 1.1 09/26/2011 1339       Studies/Results: No results found.  Medications: I have reviewed the patient's current medications.  Assessment/Plan:  1. 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 He completed 2 cycles of R-EPOCH chemotherapy with cycle 2 initiated 12/18/2010. Restaging CT scan on 01/06/2011 revealed a marked decrease in the hypermetabolic mass at the cecum and resolution of hypermetabolic activity and small pelvic lymph nodes. There was remaining hypermetabolic activity with a soft-tissue component at the ileocecal region. He completed a third cycle of R-EPOCH beginning 01/21/2011. He was admitted to [facility] on 02/25/2011 and received etoposide/cytarabine and Rituximab. He was readmitted to [facility] and underwent high-dose chemotherapy followed by autologous stem cell support with a stem cell infusion given on 04/24/2011. -He completed 4 weeks of maintenance rituximab beginning on 08/04/2011. -restaging PET scan 11/04/2011 revealed hypermetabolic activity at the cecum with mild bowel wall thickening -colonoscopy 11/19/2011 revealed a noninflamed nodular area of uncertain significance at the terminal ileum and a biopsy showed no evidence of mantle cell lymphoma. He began every three-month maintenance Rituxan 12/01/2011, last given 02/24/2012 -restaging PET scan 05/07/12 revealed bilateral hypermetabolic lung infiltrate and no evidence of residual/recurrent lymphoma. 2. History of mucositis secondary to chemotherapy, resolved. 3. History of pancytopenia secondary to chemotherapy. 4. Status post placement of apheresis catheter 01/20/2011. Status post removal of  a retained catheter cuff on 06/26/2011. 5. Intermittent fever, 01/19/2011, resolved. Question related to toxicity from high-dose chemotherapy or infectious process. 6. Malaise - improved.  7. Anorexia/weight loss. Weight is stable as  compared to 3 weeks ago. 8. Skin infection at the right side of the nose requiring hospital admission at [facility] in October of 2012. Improved following antibiotic therapy. There was persistent erythema with a discharge at the distal right nose/nostril on 02/24/2012. A culture was positive for group G. streptococcus and staph aureus. He was treated with amoxicillin. 9. Pulmonary infiltrates on PET scan 05/07/2012. Likely infectious. He is currently completing a course of Bactrim therapy. He completed prednisone 06/08/2012. 10. Fever coinciding with completion of the prednisone taper. Question etiology.  Disposition-Mark [name] appears stable. The etiology of the recent fevers is unclear. He has undergone recent evaluation at [facility]. We will contact [facility] for those records. He will return for a followup visit and labs in 2 weeks. He will contact the office in the interim with any problems.  Patient seen with Dr. Truett Perna.   Lonna Cobb ANP/GNP-BC

## 2012-06-10 ENCOUNTER — Encounter: Payer: Self-pay | Admitting: Emergency Medicine

## 2012-06-23 ENCOUNTER — Other Ambulatory Visit (HOSPITAL_BASED_OUTPATIENT_CLINIC_OR_DEPARTMENT_OTHER): Payer: BC Managed Care – PPO | Admitting: Lab

## 2012-06-23 ENCOUNTER — Telehealth: Payer: Self-pay | Admitting: Oncology

## 2012-06-23 ENCOUNTER — Ambulatory Visit (HOSPITAL_BASED_OUTPATIENT_CLINIC_OR_DEPARTMENT_OTHER): Payer: BC Managed Care – PPO | Admitting: Oncology

## 2012-06-23 VITALS — BP 111/64 | HR 86 | Temp 96.8°F | Ht 70.5 in | Wt 176.4 lb

## 2012-06-23 DIAGNOSIS — D61818 Other pancytopenia: Secondary | ICD-10-CM

## 2012-06-23 DIAGNOSIS — C8313 Mantle cell lymphoma, intra-abdominal lymph nodes: Secondary | ICD-10-CM

## 2012-06-23 LAB — CBC WITH DIFFERENTIAL/PLATELET
BASO%: 0.6 % (ref 0.0–2.0)
Eosinophils Absolute: 0.1 10*3/uL (ref 0.0–0.5)
MCHC: 33.5 g/dL (ref 32.0–36.0)
MCV: 98 fL (ref 79.3–98.0)
MONO#: 0.4 10*3/uL (ref 0.1–0.9)
MONO%: 19.8 % — ABNORMAL HIGH (ref 0.0–14.0)
NEUT#: 1.3 10*3/uL — ABNORMAL LOW (ref 1.5–6.5)
RBC: 3.31 10*6/uL — ABNORMAL LOW (ref 4.20–5.82)
RDW: 18.1 % — ABNORMAL HIGH (ref 11.0–14.6)
WBC: 2.2 10*3/uL — ABNORMAL LOW (ref 4.0–10.3)

## 2012-06-23 NOTE — Telephone Encounter (Signed)
appts made and printed for  Pt aom °

## 2012-06-23 NOTE — Progress Notes (Signed)
[facility]    OFFICE PROGRESS NOTE   INTERVAL HISTORY:   He returns as scheduled. He has been tapered off of the prednisone. He continues Bactrim twice daily. He reports altered taste that has limited his oral intake.  Mark [name] continues to exercise regularly. He walks approximately 2.5 miles daily and lifts weights. No fever. No significant cough. He reports feeling significantly better as compared to when he was first discovered to have the pulmonary infiltrates. No abdominal pain.  Objective:  Vital signs in last 24 hours:  Blood pressure 111/64, pulse 86, temperature 96.8 F (36 C), temperature source Oral, height 5' 10.5" (1.791 m), weight 176 lb 6.4 oz (80.015 kg).    HEENT: Mild whitecoat over the tongue, no buccal thrush, no ulcer Lymphatics: No axillary or inguinal nodes Resp: Lungs clear bilaterally in the anterior and posterior lung fields, no respiratory distress Cardio: Regular rate and rhythm GI: No hepatosplenomegaly, no mass Vascular: No leg edema   Lab Results:  Lab Results  Component Value Date   WBC 2.2* 06/23/2012   HGB 10.9* 06/23/2012   HCT 32.5* 06/23/2012   MCV 98.0 06/23/2012   PLT 137* 06/23/2012   ANC 1.3    Medications: I have reviewed the patient's current medications.  Assessment/Plan: 1. 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 He completed 2 cycles of R-EPOCH chemotherapy with cycle 2 initiated 12/18/2010. Restaging CT scan on 01/06/2011 revealed a marked decrease in the hypermetabolic mass at the cecum and resolution of hypermetabolic activity and small pelvic lymph nodes. There was remaining hypermetabolic activity with a soft-tissue component at the ileocecal region. He completed a third cycle of R-EPOCH beginning 01/21/2011. He was admitted to [facility] on 02/25/2011 and received etoposide/cytarabine and Rituximab. He was readmitted to  [facility] and underwent high-dose chemotherapy followed by autologous stem cell support with a stem cell infusion given on 04/24/2011. -He completed 4 weeks of maintenance rituximab beginning on 08/04/2011. -restaging PET scan 11/04/2011 revealed hypermetabolic activity at the cecum with mild bowel wall thickening -colonoscopy 11/19/2011 revealed a noninflamed nodular area of uncertain significance at the terminal ileum and a biopsy showed no evidence of mantle cell lymphoma. He began every three-month maintenance Rituxan 12/01/2011, last given 02/24/2012 -restaging PET scan 05/07/12 revealed bilateral hypermetabolic lung infiltrate and no evidence of residual/recurrent lymphoma. 2. History of mucositis secondary to chemotherapy, resolved. 3. History of pancytopenia secondary to chemotherapy. Mild pancytopenia today may reflect polypharmacy 4. Status post placement of apheresis catheter 01/20/2011. Status post removal of a retained catheter cuff on 06/26/2011. 5. Intermittent fever, 01/19/2011, resolved. Question related to toxicity from high-dose chemotherapy or infectious process. 6. Malaise - improved.  7. Anorexia/weight loss. Weight has been stable over the past month. 8. Skin infection at the right side of the nose requiring hospital admission at [facility] in October of 2012. Improved following antibiotic therapy. There was persistent erythema with a discharge at the distal right nose/nostril on 02/24/2012. A culture was positive for group G. streptococcus and staph aureus. He was treated with amoxicillin. 9. Pulmonary infiltrates on PET scan 05/07/2012. Likely infectious/inflammatory. He is currently completing a course of Bactrim therapy. He completed prednisone 06/08/2012. 10. Fever coinciding with completion of the prednisone taper. No further fever.  Disposition:  His overall status is stable. He has developed mild pancytopenia. The pancytopenia could be related to the recent  infectious/inflammatory process, polypharmacy, or less likely progression  of the mantle cell lymphoma. He knows to contact us for a fever or symptoms of infection. He will return for a CBC in one week.  Mark [name] is scheduled to see Dr. Greggory Stallion on 07/06/2012. He will have a repeat CT of the chest that day. He will return for an office visit here on 07/15/2012.   Thornton Papas, MD  06/23/2012  11:21 AM

## 2012-06-29 ENCOUNTER — Other Ambulatory Visit (HOSPITAL_BASED_OUTPATIENT_CLINIC_OR_DEPARTMENT_OTHER): Payer: BC Managed Care – PPO | Admitting: Lab

## 2012-06-29 DIAGNOSIS — C8313 Mantle cell lymphoma, intra-abdominal lymph nodes: Secondary | ICD-10-CM

## 2012-06-29 LAB — CBC WITH DIFFERENTIAL/PLATELET
BASO%: 0.9 % (ref 0.0–2.0)
Eosinophils Absolute: 0.1 10*3/uL (ref 0.0–0.5)
LYMPH%: 24.6 % (ref 14.0–49.0)
MCHC: 33.9 g/dL (ref 32.0–36.0)
MONO#: 0.7 10*3/uL (ref 0.1–0.9)
NEUT#: 1.1 10*3/uL — ABNORMAL LOW (ref 1.5–6.5)
Platelets: 160 10*3/uL (ref 140–400)
RBC: 3.31 10*6/uL — ABNORMAL LOW (ref 4.20–5.82)
WBC: 2.5 10*3/uL — ABNORMAL LOW (ref 4.0–10.3)
lymph#: 0.6 10*3/uL — ABNORMAL LOW (ref 0.9–3.3)

## 2012-07-01 ENCOUNTER — Telehealth: Payer: Self-pay | Admitting: *Deleted

## 2012-07-01 NOTE — Telephone Encounter (Signed)
Called pt, he received copy of labs on 7/16. Copy routed to Dr. Greggory Stallion.

## 2012-07-01 NOTE — Telephone Encounter (Signed)
Message copied by Caleb Popp on Thu Jul 01, 2012 12:32 PM ------      Message from: Thornton Papas B      Created: Wed Jun 30, 2012  7:51 PM       Please call patient, cbc is stable, f/u as scheduled, copy to Dr. Greggory Stallion

## 2012-07-15 ENCOUNTER — Telehealth: Payer: Self-pay | Admitting: Oncology

## 2012-07-15 ENCOUNTER — Ambulatory Visit (HOSPITAL_BASED_OUTPATIENT_CLINIC_OR_DEPARTMENT_OTHER): Payer: BC Managed Care – PPO | Admitting: Nurse Practitioner

## 2012-07-15 ENCOUNTER — Other Ambulatory Visit (HOSPITAL_BASED_OUTPATIENT_CLINIC_OR_DEPARTMENT_OTHER): Payer: BC Managed Care – PPO

## 2012-07-15 VITALS — BP 116/78 | HR 76 | Temp 97.0°F | Ht 70.5 in | Wt 180.9 lb

## 2012-07-15 DIAGNOSIS — C8319 Mantle cell lymphoma, extranodal and solid organ sites: Secondary | ICD-10-CM

## 2012-07-15 DIAGNOSIS — C8313 Mantle cell lymphoma, intra-abdominal lymph nodes: Secondary | ICD-10-CM

## 2012-07-15 DIAGNOSIS — D61818 Other pancytopenia: Secondary | ICD-10-CM

## 2012-07-15 DIAGNOSIS — R918 Other nonspecific abnormal finding of lung field: Secondary | ICD-10-CM

## 2012-07-15 LAB — CBC WITH DIFFERENTIAL/PLATELET
BASO%: 0.4 % (ref 0.0–2.0)
Basophils Absolute: 0 10*3/uL (ref 0.0–0.1)
Eosinophils Absolute: 0.2 10*3/uL (ref 0.0–0.5)
HCT: 32.5 % — ABNORMAL LOW (ref 38.4–49.9)
HGB: 11 g/dL — ABNORMAL LOW (ref 13.0–17.1)
LYMPH%: 10.5 % — ABNORMAL LOW (ref 14.0–49.0)
MCHC: 33.8 g/dL (ref 32.0–36.0)
MONO#: 0.5 10*3/uL (ref 0.1–0.9)
NEUT#: 3.7 10*3/uL (ref 1.5–6.5)
NEUT%: 76.1 % — ABNORMAL HIGH (ref 39.0–75.0)
Platelets: 187 10*3/uL (ref 140–400)
WBC: 4.9 10*3/uL (ref 4.0–10.3)
lymph#: 0.5 10*3/uL — ABNORMAL LOW (ref 0.9–3.3)

## 2012-07-15 NOTE — Progress Notes (Signed)
OFFICE PROGRESS NOTE  Interval history:  Mark [name] returns as scheduled. He overall is feeling well. He denies shortness of breath. Cough continues to be improved. No fever. He has completed the course of Bactrim. He remains very active. The rode 8 miles on a bike yesterday. Appetite is good.  He reports a recent CT scan at [facility] as improved but with persistent "groundglass" changes in the lungs. He has seen a pulmonologist. Lung biopsy is being considered.   Objective: Blood pressure 116/78, pulse 76, temperature 97 F (36.1 C), temperature source Oral, height 5' 10.5" (1.791 m), weight 180 lb 14.4 oz (82.056 kg).  Oropharynx is without thrush or ulceration. No palpable cervical, supraclavicular, axillary or inguinal lymph nodes. Lungs are clear. No wheezes or rales. Regular cardiac rhythm. Abdomen is soft and nontender. No organomegaly. Extremities are without edema.  Lab Results: Lab Results  Component Value Date   WBC 4.9 07/15/2012   HGB 11.0* 07/15/2012   HCT 32.5* 07/15/2012   MCV 97.0 07/15/2012   PLT 187 07/15/2012    Chemistry:    Chemistry      Component Value Date/Time   NA 140 09/26/2011 1339   NA 140 09/26/2011 1339   K 4.4 09/26/2011 1339   K 4.4 09/26/2011 1339   CL 105 09/26/2011 1339   CL 105 09/26/2011 1339   CO2 22 09/26/2011 1339   CO2 22 09/26/2011 1339   BUN 19 09/26/2011 1339   BUN 19 09/26/2011 1339   CREATININE 1.00 09/26/2011 1339   CREATININE 1.00 09/26/2011 1339      Component Value Date/Time   CALCIUM 9.0 09/26/2011 1339   CALCIUM 9.0 09/26/2011 1339   ALKPHOS 55 09/26/2011 1339   ALKPHOS 55 09/26/2011 1339   AST 22 09/26/2011 1339   AST 22 09/26/2011 1339   ALT 15 09/26/2011 1339   ALT 15 09/26/2011 1339   BILITOT 1.1 09/26/2011 1339   BILITOT 1.1 09/26/2011 1339       Studies/Results: No results found.  Medications: I have reviewed the patient's current medications.  Assessment/Plan:  1. Mantle cell lymphoma - staging evaluation  for mantle cell lymphoma involving a cecal mass and abdominal/pelvic lymph nodes. He completed 2 cycles of R-EPOCH chemotherapy with cycle 2 initiated 12/18/2010. Restaging CT scan on 01/06/2011 revealed a marked decrease in the hypermetabolic mass at the cecum and resolution of hypermetabolic activity and small pelvic lymph nodes. There was remaining hypermetabolic activity with a soft-tissue component at the ileocecal region. He completed a third cycle of R-EPOCH beginning 01/21/2011. He was admitted to [facility] on 02/25/2011 and received etoposide/cytarabine and Rituximab. He was readmitted to [facility] and underwent high-dose chemotherapy followed by autologous stem cell support with a stem cell infusion given on 04/24/2011. -He completed 4 weeks of maintenance rituximab beginning on 08/04/2011. -restaging PET scan 11/04/2011 revealed hypermetabolic activity at the cecum with mild bowel wall thickening -colonoscopy 11/19/2011 revealed a noninflamed nodular area of uncertain significance at the terminal ileum and a biopsy showed no evidence of mantle cell lymphoma. He began every three-month maintenance Rituxan 12/01/2011, last given 02/24/2012 -restaging PET scan 05/07/12 revealed bilateral hypermetabolic lung infiltrate and no evidence of residual/recurrent lymphoma. 2. History of mucositis secondary to chemotherapy, resolved. 3. History of pancytopenia secondary to chemotherapy. Mild pancytopenia today may reflect polypharmacy 4. Status post placement of apheresis catheter 01/20/2011. Status post removal of a retained catheter cuff on 06/26/2011. 5. Intermittent fever, 01/19/2011, resolved. Question related to toxicity from high-dose chemotherapy or  infectious process. 6. Malaise - improved.  7. Anorexia/weight loss. Improved. 8. Skin infection at the right side of the nose requiring hospital admission at [facility] in October of 2012. Improved following antibiotic therapy. There was persistent  erythema with a discharge at the distal right nose/nostril on 02/24/2012. A culture was positive for group G. streptococcus and staph aureus. He was treated with amoxicillin. 9. Pulmonary infiltrates on PET scan 05/07/2012. Likely infectious/inflammatory. He completed prednisone 06/08/2012. He recently completed a course of Bactrim. He is status post evaluation by pulmonary. Lung biopsy is being considered. 10. Fever coinciding with completion of the prednisone taper. No further fever. 11. Mild pancytopenia on labs 06/23/2012 and 06/29/2012. The white count and platelet count are in normal range on today's labs. Hemoglobin remains stable.  Disposition-[name] appears stable. He continues close followup at [facility] regarding pulmonary issues. He will return for a followup visit here in 2 months. He will contact the office in the interim with any problems.  Plan reviewed with Dr. Truett Perna.  Lonna Cobb ANP/GNP-BC

## 2012-07-15 NOTE — Telephone Encounter (Signed)
appts made and printed for pt aom °

## 2012-09-21 ENCOUNTER — Other Ambulatory Visit (HOSPITAL_BASED_OUTPATIENT_CLINIC_OR_DEPARTMENT_OTHER): Payer: BC Managed Care – PPO | Admitting: Lab

## 2012-09-21 ENCOUNTER — Ambulatory Visit (HOSPITAL_BASED_OUTPATIENT_CLINIC_OR_DEPARTMENT_OTHER): Payer: BC Managed Care – PPO | Admitting: Oncology

## 2012-09-21 ENCOUNTER — Telehealth: Payer: Self-pay | Admitting: Oncology

## 2012-09-21 VITALS — BP 123/78 | HR 72 | Temp 96.9°F | Resp 20 | Ht 70.5 in | Wt 183.3 lb

## 2012-09-21 DIAGNOSIS — C8313 Mantle cell lymphoma, intra-abdominal lymph nodes: Secondary | ICD-10-CM

## 2012-09-21 DIAGNOSIS — D490 Neoplasm of unspecified behavior of digestive system: Secondary | ICD-10-CM

## 2012-09-21 DIAGNOSIS — R918 Other nonspecific abnormal finding of lung field: Secondary | ICD-10-CM

## 2012-09-21 LAB — CBC WITH DIFFERENTIAL/PLATELET
Basophils Absolute: 0 10*3/uL (ref 0.0–0.1)
EOS%: 1.5 % (ref 0.0–7.0)
Eosinophils Absolute: 0.1 10*3/uL (ref 0.0–0.5)
HCT: 38.9 % (ref 38.4–49.9)
HGB: 13 g/dL (ref 13.0–17.1)
MCH: 33.5 pg — ABNORMAL HIGH (ref 27.2–33.4)
MCV: 100.3 fL — ABNORMAL HIGH (ref 79.3–98.0)
MONO%: 7.9 % (ref 0.0–14.0)
NEUT#: 6.4 10*3/uL (ref 1.5–6.5)
NEUT%: 79.7 % — ABNORMAL HIGH (ref 39.0–75.0)
Platelets: 146 10*3/uL (ref 140–400)
RDW: 15 % — ABNORMAL HIGH (ref 11.0–14.6)

## 2012-09-21 MED ORDER — INFLUENZA VIRUS VACC SPLIT PF IM SUSP
0.5000 mL | Freq: Once | INTRAMUSCULAR | Status: DC
  Filled 2012-09-21: qty 0.5

## 2012-09-21 NOTE — Telephone Encounter (Signed)
appts made and printed for pt aom °

## 2012-09-21 NOTE — Progress Notes (Signed)
[facility]    OFFICE PROGRESS NOTE   INTERVAL HISTORY:   He returns as scheduled. He reports undergoing an open lung biopsy via a VATS approach last month (I have not received the pathology report). He reports the pathology revealed a nonspecific inflammatory process with no evidence of lymphoma or an active infection. He is now followed in pulmonary medicine at [facility]. The DLCO remains low and he is taking prednisone at a dose of 40 mg daily.  Mark [name] denies fever, night sweats, anorexia, and the palpable lymph nodes. He feels well. He is exercising regularly.  Objective:  Vital signs in last 24 hours:  Blood pressure 123/78, pulse 72, temperature 96.9 F (36.1 C), temperature source Oral, resp. rate 20, height 5' 10.5" (1.791 m), weight 183 lb 4.8 oz (83.144 kg).    HEENT: No thrush Lymphatics: No cervical, supra-clavicular, axillary, or inguinal nodes Resp: Lungs clear bilaterally Cardio: Regular rate and rhythm GI: No hepatosplenomegaly, nontender, no mass Vascular: No leg edema   Lab Results:  Lab Results  Component Value Date   WBC 8.0 09/21/2012   HGB 13.0 09/21/2012   HCT 38.9 09/21/2012   MCV 100.3* 09/21/2012   PLT 146 09/21/2012   ANC 6.4    Medications: I have reviewed the patient's current medications.  Assessment/Plan: 1. 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 He completed 2 cycles of R-EPOCH chemotherapy with cycle 2 initiated 12/18/2010. Restaging CT scan on 01/06/2011 revealed a marked decrease in the hypermetabolic mass at the cecum and resolution of hypermetabolic activity and small pelvic lymph nodes. There was remaining hypermetabolic activity with a soft-tissue component at the ileocecal region. He completed a third cycle of R-EPOCH beginning 01/21/2011. He was admitted to [facility] on 02/25/2011 and received etoposide/cytarabine and  Rituximab. He was readmitted to [facility] and underwent high-dose chemotherapy followed by autologous stem cell support with a stem cell infusion given on 04/24/2011. -He completed 4 weeks of maintenance rituximab beginning on 08/04/2011. -restaging PET scan 11/04/2011 revealed hypermetabolic activity at the cecum with mild bowel wall thickening -colonoscopy 11/19/2011 revealed a noninflamed nodular area of uncertain significance at the terminal ileum and a biopsy showed no evidence of mantle cell lymphoma. He began every three-month maintenance Rituxan 12/01/2011, last given 02/24/2012 -restaging PET scan 05/07/12 revealed bilateral hypermetabolic lung infiltrate and no evidence of residual/recurrent lymphoma. 2. History of mucositis secondary to chemotherapy, resolved. 3. History of pancytopenia secondary to chemotherapy. Improved 4. Status post placement of apheresis catheter 01/20/2011. Status post removal of a retained catheter cuff on 06/26/2011. 5. Intermittent fever, 01/19/2011, resolved. Question related to toxicity from high-dose chemotherapy or infectious process. 6. Malaise - improved.  7. Anorexia/weight loss. Improved. 8. Skin infection at the right side of the nose requiring hospital admission at [facility] in October of 2012. Improved following antibiotic therapy. There was persistent erythema with a discharge at the distal right nose/nostril on 02/24/2012. A culture was positive for group G. streptococcus and staph aureus. He was treated with amoxicillin. 9. Pulmonary infiltrates on PET scan 05/07/2012. Likely infectious/inflammatory. He completed a course of prednisone 06/08/2012. He underwent an open lung biopsy at [facility] and is again taking prednisone. He is on Bactrim prophylaxis.  10. Fever coinciding with completion of the prednisone taper. No further fever.       11. Mild pancytopenia on labs 06/23/2012 and 06/29/2012. Resolved    Disposition:  He remains in clinical  remission from the mantle cell lymphoma. He will continue followup in pulmonary medicine at [facility] for management of the pulmonary infiltrates. We will followup on the pathology report from the open lung biopsy. He has an appointment with Dr. Greggory Stallion next month. Mark [name] will return for an office visit here in 2 months. He declined an influenza vaccine today and will discuss the indication for this vaccine with the [facility] physicians.   Thornton Papas, MD  09/21/2012  1:38 PM

## 2012-10-12 ENCOUNTER — Telehealth: Payer: Self-pay | Admitting: *Deleted

## 2012-10-12 DIAGNOSIS — C8313 Mantle cell lymphoma, intra-abdominal lymph nodes: Secondary | ICD-10-CM

## 2012-10-12 NOTE — Telephone Encounter (Signed)
Left VM for patient that MD suggests his PCP check/manage glucose issue, but if he wishes we can do testing for him and then have PCP manage if needed.

## 2012-10-12 NOTE — Telephone Encounter (Signed)
Patient reports physician wants him on Prednisone for 2 more months. Asking if he should come in to have the lab test that was not done at last visit? Per Dr. Truett Perna: Patient had requested glucose level checked due to Prednisone, would prefer PCP to check and manage, but we could do testing if he wishes.

## 2012-12-27 ENCOUNTER — Ambulatory Visit (HOSPITAL_BASED_OUTPATIENT_CLINIC_OR_DEPARTMENT_OTHER): Payer: BC Managed Care – PPO | Admitting: Oncology

## 2012-12-27 ENCOUNTER — Telehealth: Payer: Self-pay | Admitting: Oncology

## 2012-12-27 ENCOUNTER — Other Ambulatory Visit (HOSPITAL_BASED_OUTPATIENT_CLINIC_OR_DEPARTMENT_OTHER): Payer: BC Managed Care – PPO | Admitting: Lab

## 2012-12-27 VITALS — BP 138/92 | HR 108 | Temp 97.0°F | Resp 18 | Ht 70.5 in | Wt 192.1 lb

## 2012-12-27 DIAGNOSIS — C8313 Mantle cell lymphoma, intra-abdominal lymph nodes: Secondary | ICD-10-CM

## 2012-12-27 DIAGNOSIS — R918 Other nonspecific abnormal finding of lung field: Secondary | ICD-10-CM

## 2012-12-27 LAB — CBC WITH DIFFERENTIAL/PLATELET
Eosinophils Absolute: 0.1 10*3/uL (ref 0.0–0.5)
HCT: 38.8 % (ref 38.4–49.9)
LYMPH%: 15.2 % (ref 14.0–49.0)
MCHC: 33.2 g/dL (ref 32.0–36.0)
MCV: 102.4 fL — ABNORMAL HIGH (ref 79.3–98.0)
MONO#: 1 10*3/uL — ABNORMAL HIGH (ref 0.1–0.9)
MONO%: 13.6 % (ref 0.0–14.0)
NEUT%: 69.7 % (ref 39.0–75.0)
Platelets: 137 10*3/uL — ABNORMAL LOW (ref 140–400)
WBC: 7.6 10*3/uL (ref 4.0–10.3)

## 2012-12-27 NOTE — Telephone Encounter (Signed)
gv and printed appt schedule for pt for march...the patient aware °

## 2012-12-27 NOTE — Progress Notes (Signed)
[facility]    OFFICE PROGRESS NOTE   INTERVAL HISTORY:   He returns as scheduled. He is now completing a prednisone taper for treatment of interstitial lung disease. He is followed in pulmonary medicine at [facility]. Mark [name] on complains of bloating/weight gain while on prednisone. He developed a zoster infection over the right chest approximately 2 weeks ago and was treated with antiviral therapy by Dr. Greggory Stallion. The rash is healing. No fever or palpable lymph nodes.  Objective:  Vital signs in last 24 hours:  Blood pressure 138/92, pulse 108, temperature 97 F (36.1 C), temperature source Oral, resp. rate 18, height 5' 10.5" (1.791 m), weight 192 lb 1.6 oz (87.136 kg).    HEENT: Face is cushingoid, neck without mass Lymphatics: No cervical, supraclavicular, axillary, or inguinal nodes Resp: End inspiratory rhonchi at the extreme right posterior base, the lungs are otherwise clear, no respiratory distress Cardio: Regular rate and rhythm GI: No hepatosplenomegaly, nontender, no mass Vascular: No leg edema  Skin: Healing zoster rash at the right anterior chest/upper abdomen     Lab Results:  Lab Results  Component Value Date   WBC 7.6 12/27/2012   HGB 12.9* 12/27/2012   HCT 38.8 12/27/2012   MCV 102.4* 12/27/2012   PLT 137* 12/27/2012   ANC 5.3    Medications: I have reviewed the patient's current medications.  Assessment/Plan: 1. 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 He completed 2 cycles of R-EPOCH chemotherapy with cycle 2 initiated 12/18/2010. Restaging CT scan on 01/06/2011 revealed a marked decrease in the hypermetabolic mass at the cecum and resolution of hypermetabolic activity and small pelvic lymph nodes. There was remaining hypermetabolic activity with a soft-tissue component at the ileocecal region. He completed a third cycle of R-EPOCH beginning 01/21/2011. He  was admitted to [facility] on 02/25/2011 and received etoposide/cytarabine and Rituximab. He was readmitted to [facility] and underwent high-dose chemotherapy followed by autologous stem cell support with a stem cell infusion given on 04/24/2011. -He completed 4 weeks of maintenance rituximab beginning on 08/04/2011. -restaging PET scan 11/04/2011 revealed hypermetabolic activity at the cecum with mild bowel wall thickening -colonoscopy 11/19/2011 revealed a noninflamed nodular area of uncertain significance at the terminal ileum and a biopsy showed no evidence of mantle cell lymphoma. He began every three-month maintenance Rituxan 12/01/2011, last given 02/24/2012 -restaging PET scan 05/07/12 revealed bilateral hypermetabolic lung infiltrate and no evidence of residual/recurrent lymphoma. 2. History of mucositis secondary to chemotherapy, resolved. 3. History of pancytopenia secondary to chemotherapy. Improved 4. Status post placement of apheresis catheter 01/20/2011. Status post removal of a retained catheter cuff on 06/26/2011. 5. Intermittent fever, 01/19/2011, resolved. Question related to toxicity from high-dose chemotherapy or infectious process. 6. Malaise - improved.  7. Anorexia/weight loss. Improved. 8. Skin infection at the right side of the nose requiring hospital admission at [facility] in October of 2012. Improved following antibiotic therapy. There was persistent erythema with a discharge at the distal right nose/nostril on 02/24/2012. A culture was positive for group G. streptococcus and staph aureus. He was treated with amoxicillin. 9. Pulmonary infiltrates on PET scan 05/07/2012. Likely infectious/inflammatory. He completed a course of prednisone 06/08/2012. He underwent an open lung biopsy at [facility] without a specific diagnosis. He is being treated with prednisone for interstitial lung disease and is currently completing a prednisone taper. He is on Bactrim  prophylaxis. 10. 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 zoster rash at the right chest December 2013  Disposition:  He remains in clinical remission from the mantle cell lymphoma. He will continue the prednisone taper and followup with pulmonary medicine. Mark [name] will return for an office visit in 2 months.   Mark Papas, MD  12/27/2012  11:02 AM

## 2013-01-29 ENCOUNTER — Other Ambulatory Visit: Payer: Self-pay

## 2013-02-24 ENCOUNTER — Ambulatory Visit (HOSPITAL_BASED_OUTPATIENT_CLINIC_OR_DEPARTMENT_OTHER): Payer: BC Managed Care – PPO | Admitting: Lab

## 2013-02-24 ENCOUNTER — Telehealth: Payer: Self-pay | Admitting: Oncology

## 2013-02-24 ENCOUNTER — Ambulatory Visit (HOSPITAL_BASED_OUTPATIENT_CLINIC_OR_DEPARTMENT_OTHER): Payer: BC Managed Care – PPO | Admitting: Nurse Practitioner

## 2013-02-24 ENCOUNTER — Other Ambulatory Visit (HOSPITAL_BASED_OUTPATIENT_CLINIC_OR_DEPARTMENT_OTHER): Payer: BC Managed Care – PPO | Admitting: Lab

## 2013-02-24 VITALS — BP 103/77 | HR 86 | Temp 97.7°F | Resp 20 | Ht 70.5 in | Wt 194.9 lb

## 2013-02-24 DIAGNOSIS — R5381 Other malaise: Secondary | ICD-10-CM

## 2013-02-24 DIAGNOSIS — C8313 Mantle cell lymphoma, intra-abdominal lymph nodes: Secondary | ICD-10-CM

## 2013-02-24 DIAGNOSIS — J8410 Pulmonary fibrosis, unspecified: Secondary | ICD-10-CM

## 2013-02-24 LAB — CBC WITH DIFFERENTIAL/PLATELET
Basophils Absolute: 0 10*3/uL (ref 0.0–0.1)
EOS%: 2.9 % (ref 0.0–7.0)
HCT: 37.3 % — ABNORMAL LOW (ref 38.4–49.9)
HGB: 12.3 g/dL — ABNORMAL LOW (ref 13.0–17.1)
LYMPH%: 15.5 % (ref 14.0–49.0)
MCH: 33.9 pg — ABNORMAL HIGH (ref 27.2–33.4)
MCHC: 33 g/dL (ref 32.0–36.0)
MCV: 102.8 fL — ABNORMAL HIGH (ref 79.3–98.0)
MONO%: 11.1 % (ref 0.0–14.0)
NEUT%: 70 % (ref 39.0–75.0)

## 2013-02-24 NOTE — Telephone Encounter (Signed)
gv and printed appt schedule for pt for July...sent pt back to the lab

## 2013-02-24 NOTE — Progress Notes (Signed)
OFFICE PROGRESS NOTE  Interval history:  Mr. Berberian returns as scheduled. He is completing a prednisone taper. He asked approximately 1 week remaining until he completes the taper. No shortness of breath or cough. No fevers or sweats. He has a good appetite and overall good energy level. He has occasional discomfort at the site of the previous zoster rash.   Objective: Blood pressure 103/77, pulse 86, temperature 97.7 F (36.5 C), temperature source Oral, resp. rate 20, height 5' 10.5" (1.791 m), weight 194 lb 14.4 oz (88.406 kg).  Face is cushingoid. No palpable cervical, supraclavicular, axillary or inguinal lymph nodes. Faint rales at the right lung base. Lungs otherwise clear. Regular cardiac rhythm. Abdomen soft and nontender. No organomegaly. Extremities are without edema. Skin changes at the right anterolateral chest at previous site of zoster rash.  Lab Results: Lab Results  Component Value Date   WBC 6.5 02/24/2013   HGB 12.3* 02/24/2013   HCT 37.3* 02/24/2013   MCV 102.8* 02/24/2013   PLT 189 02/24/2013    Chemistry:    Chemistry      Component Value Date/Time   NA 140 09/26/2011 1339   K 4.4 09/26/2011 1339   CL 105 09/26/2011 1339   CO2 22 09/26/2011 1339   BUN 19 09/26/2011 1339   CREATININE 1.00 09/26/2011 1339      Component Value Date/Time   CALCIUM 9.0 09/26/2011 1339   ALKPHOS 55 09/26/2011 1339   AST 22 09/26/2011 1339   ALT 15 09/26/2011 1339   BILITOT 1.1 09/26/2011 1339       Studies/Results: No results found.  Medications: I have reviewed the patient's current medications.  Assessment/Plan:  1. 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 He completed 2 cycles of R-EPOCH chemotherapy with cycle 2 initiated 12/18/2010. Restaging CT scan on 01/06/2011 revealed a marked decrease in the hypermetabolic mass at the cecum and resolution of hypermetabolic activity and small  pelvic lymph nodes. There was remaining hypermetabolic activity with a soft-tissue component at the ileocecal region. He completed a third cycle of R-EPOCH beginning 01/21/2011. He was admitted to [facility] on 02/25/2011 and received etoposide/cytarabine and Rituximab. He was readmitted to [facility] and underwent high-dose chemotherapy followed by autologous stem cell support with a stem cell infusion given on 04/24/2011. -He completed 4 weeks of maintenance rituximab beginning on 08/04/2011. -restaging PET scan 11/04/2011 revealed hypermetabolic activity at the cecum with mild bowel wall thickening -colonoscopy 11/19/2011 revealed a noninflamed nodular area of uncertain significance at the terminal ileum and a biopsy showed no evidence of mantle cell lymphoma. He began every three-month maintenance Rituxan 12/01/2011, last given 02/24/2012 -restaging PET scan 05/07/12 revealed bilateral hypermetabolic lung infiltrate and no evidence of residual/recurrent lymphoma. 2. History of mucositis secondary to chemotherapy, resolved. 3. History of pancytopenia secondary to chemotherapy. Improved 4. Status post placement of apheresis catheter 01/20/2011. Status post removal of a retained catheter cuff on 06/26/2011. 5. Intermittent fever, 01/19/2011, resolved. Question related to toxicity from high-dose chemotherapy or infectious process. 6. Malaise - improved.  7. Anorexia/weight loss. Improved. 8. Skin infection at the right side of the nose requiring hospital admission at [facility] in October of 2012. Improved following antibiotic therapy. There was persistent erythema with a discharge at the distal right nose/nostril on 02/24/2012. A culture was positive for group G. streptococcus and staph aureus. He was treated with amoxicillin. 9. Pulmonary infiltrates on PET scan 05/07/2012. Likely infectious/inflammatory. He  completed a course of prednisone 06/08/2012. He underwent an open lung biopsy at [facility]  without a specific diagnosis. He is being treated with prednisone for interstitial lung disease and is currently completing a prednisone taper.  10. Cushingoid changes secondary to steroids 11. Zoster rash at the right chest December 2013.  Disposition-Mark [name] appears stable. He remains in clinical remission from the mantle cell lymphoma. He is completing the steroid taper. He has followup with Dr. Greggory Stallion in May. He will return for a followup visit here in 4 months. He will contact the office in the interim with any problems.  Plan reviewed with Dr. Truett Perna.    Lonna Cobb ANP/GNP-BC

## 2013-04-21 ENCOUNTER — Telehealth: Payer: Self-pay | Admitting: *Deleted

## 2013-04-21 ENCOUNTER — Other Ambulatory Visit: Payer: Self-pay | Admitting: *Deleted

## 2013-04-21 DIAGNOSIS — C8313 Mantle cell lymphoma, intra-abdominal lymph nodes: Secondary | ICD-10-CM

## 2013-04-21 NOTE — Telephone Encounter (Signed)
Patient called to request Dr. Truett Perna order PET scan for him. Sees Dr. Greggory Stallion on 05/04/13 and he is requesting one done prior to visit to take with him. OK per Dr. Truett Perna. POF to scheduler.

## 2013-04-27 ENCOUNTER — Encounter (HOSPITAL_COMMUNITY)
Admission: RE | Admit: 2013-04-27 | Discharge: 2013-04-27 | Disposition: A | Discharge status: Home / Self Care | Payer: Medicare Other | Source: Ambulatory Visit | Attending: Oncology | Admitting: Oncology

## 2013-04-27 DIAGNOSIS — C8313 Mantle cell lymphoma, intra-abdominal lymph nodes: Secondary | ICD-10-CM

## 2013-04-27 MED ORDER — FLUDEOXYGLUCOSE F - 18 (FDG) INJECTION
18.8000 | Freq: Once | INTRAVENOUS | Status: AC | PRN
  Administered 2013-04-27: 18.8 via INTRAVENOUS

## 2013-05-02 ENCOUNTER — Telehealth: Payer: Self-pay | Admitting: *Deleted

## 2013-05-02 NOTE — Telephone Encounter (Signed)
Pt notified that PET is negative for lymphoma and improved inflammation in lungs

## 2013-05-04 DIAGNOSIS — Z949 Transplanted organ and tissue status, unspecified: Secondary | ICD-10-CM | POA: Insufficient documentation

## 2013-06-23 ENCOUNTER — Ambulatory Visit (HOSPITAL_BASED_OUTPATIENT_CLINIC_OR_DEPARTMENT_OTHER): Payer: Medicare Other | Admitting: Oncology

## 2013-06-23 ENCOUNTER — Telehealth: Payer: Self-pay | Admitting: Oncology

## 2013-06-23 ENCOUNTER — Other Ambulatory Visit (HOSPITAL_BASED_OUTPATIENT_CLINIC_OR_DEPARTMENT_OTHER): Payer: Medicare Other | Admitting: Lab

## 2013-06-23 VITALS — BP 130/73 | HR 72 | Temp 97.4°F | Resp 18 | Ht 70.5 in | Wt 196.6 lb

## 2013-06-23 DIAGNOSIS — C8313 Mantle cell lymphoma, intra-abdominal lymph nodes: Secondary | ICD-10-CM

## 2013-06-23 LAB — CBC WITH DIFFERENTIAL/PLATELET
Basophils Absolute: 0 10*3/uL (ref 0.0–0.1)
Eosinophils Absolute: 0.2 10*3/uL (ref 0.0–0.5)
HCT: 34.2 % — ABNORMAL LOW (ref 38.4–49.9)
HGB: 12 g/dL — ABNORMAL LOW (ref 13.0–17.1)
LYMPH%: 18 % (ref 14.0–49.0)
MCV: 95.6 fL (ref 79.3–98.0)
MONO%: 12.3 % (ref 0.0–14.0)
NEUT#: 3.7 10*3/uL (ref 1.5–6.5)
NEUT%: 66.2 % (ref 39.0–75.0)
Platelets: 171 10*3/uL (ref 140–400)
RDW: 14.4 % (ref 11.0–14.6)

## 2013-06-23 NOTE — Progress Notes (Signed)
[facility]    OFFICE PROGRESS NOTE   INTERVAL HISTORY:   He returns as scheduled. He feels well. Good appetite. He is exercising frequently. No fever or night sweats. No abdominal pain. The cushingoid changes have improved. No significant cough or dyspnea.  A restaging PET scan on 04/27/2013 was negative for lymphoma. The bilateral interstitial and alveolar groundglass attenuation has diminished compared to 05/07/2012, but not completely resolved. No associated hypermetabolism  Objective:  Vital signs in last 24 hours:  Blood pressure 130/73, pulse 72, temperature 97.4 F (36.3 C), temperature source Oral, resp. rate 18, height 5' 10.5" (1.791 m), weight 196 lb 9.6 oz (89.177 kg).    HEENT: Neck without mass Lymphatics: No cervical, supraclavicular, axillary, or inguinal nodes Resp: Lungs clear bilaterally Cardio: Regular rate and rhythm GI: No hepatosplenomegaly, nontender, no mass Vascular: No leg edema   Lab Results:  Lab Results  Component Value Date   WBC 5.6 06/23/2013   HGB 12.0* 06/23/2013   HCT 34.2* 06/23/2013   MCV 95.6 06/23/2013   PLT 171 06/23/2013   ANC 3.7   Medications: I have reviewed the patient's current medications.  Assessment/Plan: 1. 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 He completed 2 cycles of R-EPOCH chemotherapy with cycle 2 initiated 12/18/2010. Restaging CT scan on 01/06/2011 revealed a marked decrease in the hypermetabolic mass at the cecum and resolution of hypermetabolic activity and small pelvic lymph nodes. There was remaining hypermetabolic activity with a soft-tissue component at the ileocecal region. He completed a third cycle of R-EPOCH beginning 01/21/2011. He was admitted to [facility] on 02/25/2011 and received etoposide/cytarabine and Rituximab. He was readmitted to [facility] and underwent high-dose chemotherapy followed by  autologous stem cell support with a stem cell infusion given on 04/24/2011. -He completed 4 weeks of maintenance rituximab beginning on 08/04/2011. -restaging PET scan 11/04/2011 revealed hypermetabolic activity at the cecum with mild bowel wall thickening -colonoscopy 11/19/2011 revealed a noninflamed nodular area of uncertain significance at the terminal ileum and a biopsy showed no evidence of mantle cell lymphoma. He began every three-month maintenance Rituxan 12/01/2011, last given 02/24/2012 -restaging PET scan 05/07/12 revealed bilateral hypermetabolic lung infiltrate and no evidence of residual/recurrent lymphoma. -Restaging PET scan 04/27/2013 with no evidence of residual/recurrent lymphoma 2. History of mucositis secondary to chemotherapy, resolved. 3. History of pancytopenia secondary to chemotherapy. Improved 4. Status post placement of apheresis catheter 01/20/2011. Status post removal of a retained catheter cuff on 06/26/2011. 5. Intermittent fever, 01/19/2011, resolved. Question related to toxicity from high-dose chemotherapy or infectious process. 6. Malaise - improved.  7. Anorexia/weight loss. Improved. 8. Skin infection at the right side of the nose requiring hospital admission at [facility] in October of 2012. Improved following antibiotic therapy. There was persistent erythema with a discharge at the distal right nose/nostril on 02/24/2012. A culture was positive for group G. streptococcus and staph aureus. He was treated with amoxicillin. 9. Pulmonary infiltrates on PET scan 05/07/2012. Likely infectious/inflammatory. He completed a course of prednisone 06/08/2012. He underwent an open lung biopsy at [facility] without a specific diagnosis. He was treated with a prolonged prednisone taper and is now maintained off of prednisone.  10. Cushingoid changes secondary to steroids-improved 11. Zoster rash at the right chest December 2013.  Disposition:  Mr. Spiers remains in clinical  remission from the mantle cell lymphoma. He reports Dr. Greggory Stallion would like to schedule a 6  month restaging PET scan. He will see Dr. Greggory Stallion in November. He will return for an office visit here in 6 months.   Thornton Papas, MD  06/23/2013  11:20 AM

## 2013-06-23 NOTE — Telephone Encounter (Signed)
gv and printed appt sched and avs for pt  °

## 2013-07-26 DIAGNOSIS — N529 Male erectile dysfunction, unspecified: Secondary | ICD-10-CM | POA: Insufficient documentation

## 2013-10-20 ENCOUNTER — Other Ambulatory Visit: Payer: Self-pay

## 2013-10-24 ENCOUNTER — Encounter (HOSPITAL_COMMUNITY)
Admission: RE | Admit: 2013-10-24 | Discharge: 2013-10-24 | Disposition: A | Discharge status: Home / Self Care | Payer: Medicare Other | Source: Ambulatory Visit | Attending: Oncology | Admitting: Oncology

## 2013-10-24 DIAGNOSIS — C8313 Mantle cell lymphoma, intra-abdominal lymph nodes: Secondary | ICD-10-CM | POA: Insufficient documentation

## 2013-10-24 MED ORDER — FLUDEOXYGLUCOSE F - 18 (FDG) INJECTION
19.4000 | Freq: Once | INTRAVENOUS | Status: AC | PRN
  Administered 2013-10-24: 19.4 via INTRAVENOUS

## 2013-10-26 ENCOUNTER — Telehealth: Payer: Self-pay | Admitting: *Deleted

## 2013-10-26 NOTE — Telephone Encounter (Signed)
Message copied by Raphael Gibney on Wed Oct 26, 2013  1:59 PM ------      Message from: [place], [place] P      Created: Wed Oct 26, 2013  1:37 PM                   ----- Message -----         From: Ladene Artist, MD         Sent: 10/24/2013   7:22 PM           To: Wandalee Ferdinand, RN, Glori Luis, RN, #            Please call patient, PET is negative for lymphoma ------

## 2013-10-26 NOTE — Telephone Encounter (Signed)
Informed patient, PET was negative for lymphoma. Per Dr. Truett Perna. Patient verbalized understanding.

## 2013-10-31 ENCOUNTER — Other Ambulatory Visit: Payer: Self-pay | Admitting: Dermatology

## 2013-11-21 ENCOUNTER — Other Ambulatory Visit: Payer: Self-pay | Admitting: Dermatology

## 2013-11-22 ENCOUNTER — Other Ambulatory Visit: Payer: Self-pay | Admitting: Dermatology

## 2013-12-21 ENCOUNTER — Encounter: Payer: Self-pay | Admitting: Oncology

## 2013-12-22 ENCOUNTER — Other Ambulatory Visit (HOSPITAL_BASED_OUTPATIENT_CLINIC_OR_DEPARTMENT_OTHER): Payer: Medicare Other

## 2013-12-22 ENCOUNTER — Other Ambulatory Visit: Payer: Self-pay | Admitting: *Deleted

## 2013-12-22 ENCOUNTER — Ambulatory Visit (HOSPITAL_BASED_OUTPATIENT_CLINIC_OR_DEPARTMENT_OTHER): Payer: Medicare Other | Admitting: Oncology

## 2013-12-22 VITALS — BP 129/81 | HR 82 | Temp 96.8°F | Resp 18 | Ht 70.5 in | Wt 188.6 lb

## 2013-12-22 DIAGNOSIS — E291 Testicular hypofunction: Secondary | ICD-10-CM | POA: Diagnosis not present

## 2013-12-22 DIAGNOSIS — C8313 Mantle cell lymphoma, intra-abdominal lymph nodes: Secondary | ICD-10-CM | POA: Diagnosis not present

## 2013-12-22 DIAGNOSIS — E559 Vitamin D deficiency, unspecified: Secondary | ICD-10-CM

## 2013-12-22 DIAGNOSIS — Z139 Encounter for screening, unspecified: Secondary | ICD-10-CM

## 2013-12-22 LAB — CBC WITH DIFFERENTIAL/PLATELET
BASO%: 0.4 % (ref 0.0–2.0)
Basophils Absolute: 0 10*3/uL (ref 0.0–0.1)
EOS%: 2.6 % (ref 0.0–7.0)
Eosinophils Absolute: 0.2 10*3/uL (ref 0.0–0.5)
HEMATOCRIT: 38 % — AB (ref 38.4–49.9)
HGB: 12.6 g/dL — ABNORMAL LOW (ref 13.0–17.1)
LYMPH#: 1.4 10*3/uL (ref 0.9–3.3)
LYMPH%: 23.3 % (ref 14.0–49.0)
MCH: 32.7 pg (ref 27.2–33.4)
MCHC: 33.2 g/dL (ref 32.0–36.0)
MCV: 98.4 fL — ABNORMAL HIGH (ref 79.3–98.0)
MONO#: 0.6 10*3/uL (ref 0.1–0.9)
MONO%: 10 % (ref 0.0–14.0)
NEUT#: 3.9 10*3/uL (ref 1.5–6.5)
NEUT%: 63.7 % (ref 39.0–75.0)
Platelets: 168 10*3/uL (ref 140–400)
RBC: 3.86 10*6/uL — AB (ref 4.20–5.82)
RDW: 15.4 % — ABNORMAL HIGH (ref 11.0–14.6)
WBC: 6.1 10*3/uL (ref 4.0–10.3)

## 2013-12-22 LAB — TESTOSTERONE: TESTOSTERONE: 172 ng/dL — AB (ref 300–890)

## 2013-12-22 NOTE — Progress Notes (Signed)
[place]    OFFICE PROGRESS NOTE   INTERVAL HISTORY:   Mr. Mark [name] returns for scheduled followup of non-Hodgkin's lymphoma. He reports feeling well. No fever, night sweats, or palpable lymph nodes. He is exercising. He recently had left cataract surgery. 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 He has been diagnosed with testosterone deficiency and is now using AndroGel. He sees Dr. Rosana Hoes. He also reports he was diagnosed with vitamin B12 deficiency. His energy level has been much improved since starting testosterone and vitamin B12 replacement. He reports a history of vitamin D deficiency.  A PET scan on 10/24/2013 was negative for recurrent lymphoma.  Objective:  Vital signs in last 24 hours:  Blood pressure 129/81, pulse 82, temperature 96.8 F (36 C), temperature source Oral, resp. rate 18, height 5' 10.5" (1.791 m), weight 188 lb 9.6 oz (85.548 kg).    HEENT: Neck without mass Lymphatics: No cervical, supraclavicular, axillary, or inguinal nodes Resp: Lungs clear bilaterally Cardio: Rectal rate and rhythm GI: No hepatosplenomegaly, nontender, no mass Vascular: No leg edema    Lab Results:  Lab Results  Component Value Date   WBC 6.1 12/22/2013   HGB 12.6* 12/22/2013   HCT 38.0* 12/22/2013   MCV 98.4* 12/22/2013   PLT 168 12/22/2013   NEUTROABS 3.9 12/22/2013      Medications: I have reviewed the patient's current medications.  Assessment/Plan: 1. 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 He completed 2 cycles of R-EPOCH chemotherapy with cycle 2 initiated 12/18/2010. Restaging CT scan on 01/06/2011 revealed a marked decrease in the hypermetabolic mass at the cecum and resolution of hypermetabolic activity and small pelvic lymph nodes. There was remaining hypermetabolic activity with a soft-tissue component at the ileocecal region. He completed a third  cycle of R-EPOCH beginning 01/21/2011. He was admitted to [facility] on 02/25/2011 and received etoposide/cytarabine and Rituximab. He was readmitted to [facility] and underwent high-dose chemotherapy followed by autologous stem cell support with a stem cell infusion given on 04/24/2011. -He completed 4 weeks of maintenance rituximab beginning on 08/04/2011. -restaging PET scan 11/04/2011 revealed hypermetabolic activity at the cecum with mild bowel wall thickening -colonoscopy 11/19/2011 revealed a noninflamed nodular area of uncertain significance at the terminal ileum and a biopsy showed no evidence of mantle cell lymphoma. He began every three-month maintenance Rituxan 12/01/2011, last given 02/24/2012 -restaging PET scan 05/07/12 revealed bilateral hypermetabolic lung infiltrate and no evidence of residual/recurrent lymphoma. -Restaging PET scan 04/27/2013 with no evidence of residual/recurrent lymphoma  -Restaging PET scan on 10/24/2013 with no evidence of residual/recurrent lymphoma 2. History of mucositis secondary to chemotherapy, resolved. 3. History of pancytopenia secondary to chemotherapy. Improved 4. Status post placement of apheresis catheter 01/20/2011. Status post removal of a retained catheter cuff on 06/26/2011. 5. Intermittent fever, 01/19/2011, resolved. Question related to toxicity from high-dose chemotherapy or infectious process. 6. Malaise - improved.  7. Anorexia/weight loss. Improved. 8. Skin infection at the right side of the nose requiring hospital admission at [facility] in October of 2012. Improved following antibiotic therapy. There was persistent erythema with a discharge at the distal right nose/nostril on 02/24/2012. A culture was positive for group G. streptococcus and staph aureus. He was treated with amoxicillin. 9. Pulmonary infiltrates on PET scan 05/07/2012. Likely infectious/inflammatory. He completed a course of prednisone 06/08/2012. He underwent an open lung  biopsy at [facility] without a specific  diagnosis. He was treated with a prolonged prednisone taper and is now maintained off of prednisone.  10. Cushingoid changes secondary to steroids-improved 11. Zoster rash at the right chest December 2013.  Disposition:  Mr. Ciampi remains in clinical remission from non-Hodgkin's lymphoma. He will see Dr. Marcell Anger in May. He will return for an office visit here in 6 months.  He requested we obtain testosterone and vitamin D levels today.  Betsy Coder, MD  12/22/2013  10:01 AM

## 2013-12-23 ENCOUNTER — Telehealth: Payer: Self-pay | Admitting: *Deleted

## 2013-12-23 ENCOUNTER — Encounter: Payer: Self-pay | Admitting: *Deleted

## 2013-12-23 ENCOUNTER — Telehealth: Payer: Self-pay | Admitting: Oncology

## 2013-12-23 LAB — VITAMIN D 25 HYDROXY (VIT D DEFICIENCY, FRACTURES): Vit D, 25-Hydroxy: 55 ng/mL (ref 30–89)

## 2013-12-23 NOTE — Telephone Encounter (Signed)
Message from pt, he is aware of testosterone results. Requests lab to be faxed to Dr. Rosana Hoes. Labs routed electronically.

## 2013-12-23 NOTE — Telephone Encounter (Signed)
Message copied by Norma Fredrickson on Fri Dec 23, 2013 11:58 AM ------      Message from: Brien Few      Created: Fri Dec 23, 2013 11:56 AM                   ----- Message -----         From: Ladell Pier, MD         Sent: 12/23/2013   9:50 AM           To: Tania Ade, RN, Ludwig Lean, RN, #            Please call patient, testosterone is low, needs to f/u with Dr. Rosana Hoes ------

## 2013-12-23 NOTE — Telephone Encounter (Signed)
s/w pt re appt for 7/9.

## 2013-12-23 NOTE — Telephone Encounter (Signed)
Called and informed patient of low testosterone and to follow up with Dr. Rosana Hoes. Per Dr. Benay Spice. Patient verbalized understanding.

## 2014-01-04 DIAGNOSIS — E291 Testicular hypofunction: Secondary | ICD-10-CM | POA: Diagnosis not present

## 2014-01-16 DIAGNOSIS — E291 Testicular hypofunction: Secondary | ICD-10-CM | POA: Diagnosis not present

## 2014-01-16 DIAGNOSIS — N529 Male erectile dysfunction, unspecified: Secondary | ICD-10-CM | POA: Diagnosis not present

## 2014-02-08 ENCOUNTER — Other Ambulatory Visit: Payer: Self-pay | Admitting: Dermatology

## 2014-02-08 DIAGNOSIS — D239 Other benign neoplasm of skin, unspecified: Secondary | ICD-10-CM | POA: Diagnosis not present

## 2014-02-08 DIAGNOSIS — L570 Actinic keratosis: Secondary | ICD-10-CM | POA: Diagnosis not present

## 2014-02-08 DIAGNOSIS — Z85820 Personal history of malignant melanoma of skin: Secondary | ICD-10-CM | POA: Diagnosis not present

## 2014-02-08 DIAGNOSIS — L819 Disorder of pigmentation, unspecified: Secondary | ICD-10-CM | POA: Diagnosis not present

## 2014-02-08 DIAGNOSIS — Z85828 Personal history of other malignant neoplasm of skin: Secondary | ICD-10-CM | POA: Diagnosis not present

## 2014-02-08 DIAGNOSIS — L821 Other seborrheic keratosis: Secondary | ICD-10-CM | POA: Diagnosis not present

## 2014-02-08 DIAGNOSIS — D485 Neoplasm of uncertain behavior of skin: Secondary | ICD-10-CM | POA: Diagnosis not present

## 2014-05-03 DIAGNOSIS — Z9484 Stem cells transplant status: Secondary | ICD-10-CM | POA: Diagnosis not present

## 2014-05-03 DIAGNOSIS — E538 Deficiency of other specified B group vitamins: Secondary | ICD-10-CM | POA: Diagnosis not present

## 2014-05-03 DIAGNOSIS — C8319 Mantle cell lymphoma, extranodal and solid organ sites: Secondary | ICD-10-CM | POA: Diagnosis not present

## 2014-05-09 DIAGNOSIS — R351 Nocturia: Secondary | ICD-10-CM | POA: Diagnosis not present

## 2014-05-09 DIAGNOSIS — E291 Testicular hypofunction: Secondary | ICD-10-CM | POA: Diagnosis not present

## 2014-05-09 DIAGNOSIS — N529 Male erectile dysfunction, unspecified: Secondary | ICD-10-CM | POA: Diagnosis not present

## 2014-05-09 DIAGNOSIS — N400 Enlarged prostate without lower urinary tract symptoms: Secondary | ICD-10-CM | POA: Diagnosis not present

## 2014-06-22 ENCOUNTER — Telehealth: Payer: Self-pay | Admitting: Oncology

## 2014-06-22 ENCOUNTER — Ambulatory Visit (HOSPITAL_BASED_OUTPATIENT_CLINIC_OR_DEPARTMENT_OTHER): Payer: Medicare Other | Admitting: Nurse Practitioner

## 2014-06-22 VITALS — BP 120/78 | HR 79 | Temp 97.1°F | Resp 18 | Ht 70.5 in | Wt 192.3 lb

## 2014-06-22 DIAGNOSIS — C8313 Mantle cell lymphoma, intra-abdominal lymph nodes: Secondary | ICD-10-CM

## 2014-06-22 DIAGNOSIS — C8590 Non-Hodgkin lymphoma, unspecified, unspecified site: Secondary | ICD-10-CM

## 2014-06-22 NOTE — Progress Notes (Signed)
[place] OFFICE PROGRESS NOTE   Diagnosis:  Non-Hodgkin's lymphoma.  INTERVAL HISTORY:   Mark [name] returns as scheduled. He feels well. He is exercising on a regular basis. He has a good appetite. No fevers or sweats. He denies pain. No shortness of breath. He has an occasional cough. No constipation or diarrhea. No nausea or vomiting. He denies bleeding.  Objective:  Vital signs in last 24 hours:  Blood pressure 120/78, pulse 79, temperature 97.1 F (36.2 C), temperature source Oral, resp. rate 18, height 5' 10.5" (1.791 m), weight 192 lb 4.8 oz (87.227 kg).    HEENT: No thrush or ulcerations. Lymphatics: No palpable cervical, supraclavicular, axillary or inguinal lymph nodes. Resp: Lungs clear. No wheezes or rales. Cardio: Regular cardiac rhythm. GI: Abdomen soft and nontender. No organomegaly. Vascular: No leg edema. Calves soft and nontender.   Lab Results:  Lab Results  Component Value Date   WBC 6.1 12/22/2013   HGB 12.6* 12/22/2013   HCT 38.0* 12/22/2013   MCV 98.4* 12/22/2013   PLT 168 12/22/2013   NEUTROABS 3.9 12/22/2013    Imaging:  No results found.  Medications: I have reviewed the patient's current medications.  Assessment/Plan: 1. 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 He completed 2 cycles of R-EPOCH chemotherapy with cycle 2 initiated 12/18/2010. Restaging CT scan on 01/06/2011 revealed a marked decrease in the hypermetabolic mass at the cecum and resolution of hypermetabolic activity and small pelvic lymph nodes. There was remaining hypermetabolic activity with a soft-tissue component at the ileocecal region. He completed a third cycle of R-EPOCH beginning 01/21/2011. He was admitted to [facility] on 02/25/2011 and received etoposide/cytarabine and Rituximab. He was readmitted to [facility] and underwent high-dose chemotherapy followed by autologous stem cell  support with a stem cell infusion given on 04/24/2011. -He completed 4 weeks of maintenance rituximab beginning on 08/04/2011. -restaging PET scan 11/04/2011 revealed hypermetabolic activity at the cecum with mild bowel wall thickening -colonoscopy 11/19/2011 revealed a noninflamed nodular area of uncertain significance at the terminal ileum and a biopsy showed no evidence of mantle cell lymphoma. He began every three-month maintenance Rituxan 12/01/2011, last given 02/24/2012 -restaging PET scan 05/07/12 revealed bilateral hypermetabolic lung infiltrate and no evidence of residual/recurrent lymphoma. -Restaging PET scan 04/27/2013 with no evidence of residual/recurrent lymphoma  -Restaging PET scan on 10/24/2013 with no evidence of residual/recurrent lymphoma.  2. History of mucositis secondary to chemotherapy, resolved. 3. History of pancytopenia secondary to chemotherapy. Improved 4. Status post placement of apheresis catheter 01/20/2011. Status post removal of a retained catheter cuff on 06/26/2011. 5. Intermittent fever, 01/19/2011, resolved. Question related to toxicity from high-dose chemotherapy or infectious process. 6. Malaise - improved.  7. Anorexia/weight loss. Improved. 8. Skin infection at the right side of the nose requiring hospital admission at [facility] in October of 2012. Improved following antibiotic therapy. There was persistent erythema with a discharge at the distal right nose/nostril on 02/24/2012. A culture was positive for group G. streptococcus and staph aureus. He was treated with amoxicillin. 9. Pulmonary infiltrates on PET scan 05/07/2012. Likely infectious/inflammatory. He completed a course of prednisone 06/08/2012. He underwent an open lung biopsy at [facility] without a specific diagnosis. He was treated with a prolonged prednisone taper and is now maintained off of prednisone.  10. Cushingoid changes secondary to steroids-improved 11. Zoster rash at the right chest  December 2013.   Disposition: Mark [name] remains  in clinical remission from non-Hodgkin's lymphoma. He has a followup appointment with Dr. Marcell Anger in November of this year. He will return for a followup visit here in 6 months. He knows to contact the office in the interim with any problems.  Plan reviewed with Dr. Benay Spice.    Ned Card ANP/GNP-BC   06/22/2014  3:17 PM

## 2014-06-22 NOTE — Telephone Encounter (Signed)
gv pt appt schedule for jan 2016 °

## 2014-08-08 DIAGNOSIS — H1045 Other chronic allergic conjunctivitis: Secondary | ICD-10-CM | POA: Diagnosis not present

## 2014-08-10 DIAGNOSIS — R351 Nocturia: Secondary | ICD-10-CM | POA: Diagnosis not present

## 2014-08-10 DIAGNOSIS — N400 Enlarged prostate without lower urinary tract symptoms: Secondary | ICD-10-CM | POA: Diagnosis not present

## 2014-08-10 DIAGNOSIS — E291 Testicular hypofunction: Secondary | ICD-10-CM | POA: Diagnosis not present

## 2014-08-10 DIAGNOSIS — N529 Male erectile dysfunction, unspecified: Secondary | ICD-10-CM | POA: Diagnosis not present

## 2014-08-17 DIAGNOSIS — M899 Disorder of bone, unspecified: Secondary | ICD-10-CM | POA: Diagnosis not present

## 2014-09-13 DIAGNOSIS — L570 Actinic keratosis: Secondary | ICD-10-CM | POA: Diagnosis not present

## 2014-09-13 DIAGNOSIS — D2370 Other benign neoplasm of skin of unspecified lower limb, including hip: Secondary | ICD-10-CM | POA: Diagnosis not present

## 2014-09-13 DIAGNOSIS — L910 Hypertrophic scar: Secondary | ICD-10-CM | POA: Diagnosis not present

## 2014-09-13 DIAGNOSIS — L821 Other seborrheic keratosis: Secondary | ICD-10-CM | POA: Diagnosis not present

## 2014-09-13 DIAGNOSIS — L819 Disorder of pigmentation, unspecified: Secondary | ICD-10-CM | POA: Diagnosis not present

## 2014-09-13 DIAGNOSIS — D1801 Hemangioma of skin and subcutaneous tissue: Secondary | ICD-10-CM | POA: Diagnosis not present

## 2014-09-13 DIAGNOSIS — Z85828 Personal history of other malignant neoplasm of skin: Secondary | ICD-10-CM | POA: Diagnosis not present

## 2014-09-13 DIAGNOSIS — L820 Inflamed seborrheic keratosis: Secondary | ICD-10-CM | POA: Diagnosis not present

## 2014-10-13 DIAGNOSIS — H2511 Age-related nuclear cataract, right eye: Secondary | ICD-10-CM | POA: Diagnosis not present

## 2014-10-13 DIAGNOSIS — Z961 Presence of intraocular lens: Secondary | ICD-10-CM | POA: Diagnosis not present

## 2014-10-13 DIAGNOSIS — H25041 Posterior subcapsular polar age-related cataract, right eye: Secondary | ICD-10-CM | POA: Diagnosis not present

## 2014-10-25 DIAGNOSIS — J849 Interstitial pulmonary disease, unspecified: Secondary | ICD-10-CM | POA: Diagnosis not present

## 2014-10-25 DIAGNOSIS — C8310 Mantle cell lymphoma, unspecified site: Secondary | ICD-10-CM | POA: Diagnosis not present

## 2014-10-25 DIAGNOSIS — Z23 Encounter for immunization: Secondary | ICD-10-CM | POA: Diagnosis not present

## 2014-12-25 ENCOUNTER — Telehealth: Payer: Self-pay | Admitting: Oncology

## 2014-12-25 ENCOUNTER — Ambulatory Visit (HOSPITAL_BASED_OUTPATIENT_CLINIC_OR_DEPARTMENT_OTHER): Payer: Medicare Other | Admitting: Oncology

## 2014-12-25 ENCOUNTER — Ambulatory Visit (HOSPITAL_BASED_OUTPATIENT_CLINIC_OR_DEPARTMENT_OTHER): Payer: Medicare Other

## 2014-12-25 VITALS — BP 133/75 | HR 72 | Temp 97.7°F | Resp 19 | Ht 70.5 in | Wt 200.3 lb

## 2014-12-25 DIAGNOSIS — C8318 Mantle cell lymphoma, lymph nodes of multiple sites: Secondary | ICD-10-CM | POA: Diagnosis not present

## 2014-12-25 DIAGNOSIS — C8313 Mantle cell lymphoma, intra-abdominal lymph nodes: Secondary | ICD-10-CM

## 2014-12-25 DIAGNOSIS — N539 Unspecified male sexual dysfunction: Secondary | ICD-10-CM | POA: Diagnosis not present

## 2014-12-25 NOTE — Telephone Encounter (Signed)
gv adn printed appt sched and avs for pt for July....sed sent to lab

## 2014-12-25 NOTE — Progress Notes (Signed)
Mark [name]   Diagnosis: Mantle cell lymphoma  INTERVAL HISTORY:   Mark [name] returns as scheduled. He feels well. No dyspnea. He is exercising. He is no longer using AndroGel. He reports the testosterone level was low when checked at [facility] in November. He received an influenza vaccine.  Objective:  Vital signs in last 24 hours:  Blood pressure 133/75, pulse 72, temperature 97.7 F (36.5 C), temperature source Oral, resp. rate 19, height 5' 10.5" (1.791 m), weight 200 lb 4.8 oz (90.855 kg), SpO2 97 %.    HEENT: Neck without mass, oropharynx without visible mass Lymphatics: No cervical, supra-clavicular, axillary, or inguinal nodes Resp: End inspiratory rhonchi at the right posterior base, no respiratory distress Cardio: Regular rate and rhythm GI: No hepatosplenic the, nontender, no mass Vascular: No leg edema   Lab Results:  Lab Results  Component Value Date   WBC 6.1 12/22/2013   HGB 12.6* 12/22/2013   HCT 38.0* 12/22/2013   MCV 98.4* 12/22/2013   PLT 168 12/22/2013   NEUTROABS 3.9 12/22/2013    Medications: I have reviewed the patient's current medications.  Assessment/Plan: 1. 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 He completed 2 cycles of R-EPOCH chemotherapy with cycle 2 initiated 12/18/2010. Restaging CT scan on 01/06/2011 revealed a marked decrease in the hypermetabolic mass at the cecum and resolution of hypermetabolic activity and small pelvic lymph nodes. There was remaining hypermetabolic activity with a soft-tissue component at the ileocecal region. He completed a third cycle of R-EPOCH beginning 01/21/2011. He was admitted to [facility] on 02/25/2011 and received etoposide/cytarabine and Rituximab. He was readmitted to [facility] and underwent high-dose chemotherapy followed by autologous stem cell support with a stem cell infusion  given on 04/24/2011. -He completed 4 weeks of maintenance rituximab beginning on 08/04/2011. -restaging PET scan 11/04/2011 revealed hypermetabolic activity at the cecum with mild bowel wall thickening -colonoscopy 11/19/2011 revealed a noninflamed nodular area of uncertain significance at the terminal ileum and a biopsy showed no evidence of mantle cell lymphoma. He began every three-month maintenance Rituxan 12/01/2011, last given 02/24/2012 -restaging PET scan 05/07/12 revealed bilateral hypermetabolic lung infiltrate and no evidence of residual/recurrent lymphoma. -Restaging PET scan 04/27/2013 with no evidence of residual/recurrent lymphoma  -Restaging PET scan on 10/24/2013 with no evidence of residual/recurrent lymphoma.  2. History of mucositis secondary to chemotherapy, resolved. 3. History of pancytopenia secondary to chemotherapy. Improved 4. Status post placement of apheresis catheter 01/20/2011. Status post removal of a retained catheter cuff on 06/26/2011. 5. Intermittent fever, 01/19/2011, resolved. Question related to toxicity from high-dose chemotherapy or infectious process. 6. History of Malaise .  7. Skin infection at the right side of the nose requiring hospital admission at [facility] in October of 2012. Improved following antibiotic therapy. There was persistent erythema with a discharge at the distal right nose/nostril on 02/24/2012. A culture was positive for group G. streptococcus and staph aureus. He was treated with amoxicillin. 8. Pulmonary infiltrates on PET scan 05/07/2012. Likely infectious/inflammatory. He completed a course of prednisone 06/08/2012. He underwent an open lung biopsy at [facility] without a specific diagnosis. He was treated with a prolonged prednisone taper and is now maintained off of prednisone.  9. Cushingoid changes secondary to steroids-improved 10. Zoster rash at the right chest December 2013.   Disposition:  He remains in clinical  remission from mantle cell lymphoma. No clinical evidence for recurrence  of the pneumonitis. He will return for an office visit in 6 months.  He requested we check the testosterone level today. He will schedule an appointment with Dr. Rosana Hoes to discuss management of the low testosterone level and sexual dysfunction.  Betsy Coder, MD   12/25/2014  3:53 PM

## 2014-12-26 LAB — TESTOSTERONE, FREE, TOTAL, SHBG
SEX HORMONE BINDING: 43 nmol/L (ref 22–77)
TESTOSTERONE-% FREE: 1.6 % (ref 1.6–2.9)
TESTOSTERONE: 118 ng/dL — AB (ref 300–890)
Testosterone, Free: 18.5 pg/mL — ABNORMAL LOW (ref 47.0–244.0)

## 2015-01-01 ENCOUNTER — Ambulatory Visit: Payer: Medicare Other

## 2015-01-15 DIAGNOSIS — E291 Testicular hypofunction: Secondary | ICD-10-CM | POA: Diagnosis not present

## 2015-01-15 DIAGNOSIS — N401 Enlarged prostate with lower urinary tract symptoms: Secondary | ICD-10-CM | POA: Diagnosis not present

## 2015-02-22 DIAGNOSIS — E291 Testicular hypofunction: Secondary | ICD-10-CM | POA: Diagnosis not present

## 2015-03-16 DIAGNOSIS — E291 Testicular hypofunction: Secondary | ICD-10-CM | POA: Diagnosis not present

## 2015-04-25 DIAGNOSIS — E291 Testicular hypofunction: Secondary | ICD-10-CM | POA: Diagnosis not present

## 2015-04-25 DIAGNOSIS — E538 Deficiency of other specified B group vitamins: Secondary | ICD-10-CM | POA: Diagnosis not present

## 2015-04-25 DIAGNOSIS — C8310 Mantle cell lymphoma, unspecified site: Secondary | ICD-10-CM | POA: Diagnosis not present

## 2015-05-24 DIAGNOSIS — E291 Testicular hypofunction: Secondary | ICD-10-CM | POA: Diagnosis not present

## 2015-07-02 ENCOUNTER — Telehealth: Payer: Self-pay | Admitting: *Deleted

## 2015-07-02 ENCOUNTER — Telehealth: Payer: Self-pay | Admitting: Oncology

## 2015-07-02 ENCOUNTER — Ambulatory Visit (HOSPITAL_BASED_OUTPATIENT_CLINIC_OR_DEPARTMENT_OTHER): Payer: Medicare Other | Admitting: Oncology

## 2015-07-02 VITALS — BP 135/86 | HR 97 | Temp 98.0°F | Resp 18 | Ht 70.5 in | Wt 197.9 lb

## 2015-07-02 DIAGNOSIS — C8313 Mantle cell lymphoma, intra-abdominal lymph nodes: Secondary | ICD-10-CM

## 2015-07-02 NOTE — Telephone Encounter (Addendum)
Pt returned call, requests to schedule Prevnar vaccine during first week of August. Request to schedulers for appointment. Order signed and held.

## 2015-07-02 NOTE — Progress Notes (Signed)
  [place] OFFICE PROGRESS NOTE   Diagnosis: Mantle cell lymphoma  INTERVAL HISTORY:   Mark [name] returns as scheduled. He feels well. Good appetite and energy level. No abdominal pain. No dyspnea. He has noted significant improvement in his energy level since beginning testosterone replacement.  Objective:  Vital signs in last 24 hours:  Blood pressure 135/86, pulse 97, temperature 98 F (36.7 C), temperature source Oral, resp. rate 18, height 5' 10.5" (1.791 m), weight 197 lb 14.4 oz (89.767 kg), SpO2 97 %.    HEENT: Neck without mass Lymphatics: No cervical, supraclavicular, axillary, or inguinal nodes Resp: Lungs clear bilaterally Cardio: Regular rate and rhythm GI: No hepatosplenic megaly, nontender, no mass Vascular: No leg edema   Medications: I have reviewed the patient's current medications.  Assessment/Plan: 1. 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 He completed 2 cycles of R-EPOCH chemotherapy with cycle 2 initiated 12/18/2010. Restaging CT scan on 01/06/2011 revealed a marked decrease in the hypermetabolic mass at the cecum and resolution of hypermetabolic activity and small pelvic lymph nodes. There was remaining hypermetabolic activity with a soft-tissue component at the ileocecal region. He completed a third cycle of R-EPOCH beginning 01/21/2011. He was admitted to [facility] on 02/25/2011 and received etoposide/cytarabine and Rituximab. He was readmitted to [facility] and underwent high-dose chemotherapy followed by autologous stem cell support with a stem cell infusion given on 04/24/2011. -He completed 4 weeks of maintenance rituximab beginning on 08/04/2011. -restaging PET scan 11/04/2011 revealed hypermetabolic activity at the cecum with mild bowel wall thickening -colonoscopy 11/19/2011 revealed a noninflamed nodular area of uncertain significance at the terminal  ileum and a biopsy showed no evidence of mantle cell lymphoma. He began every three-month maintenance Rituxan 12/01/2011, last given 02/24/2012 -restaging PET scan 05/07/12 revealed bilateral hypermetabolic lung infiltrate and no evidence of residual/recurrent lymphoma. -Restaging PET scan 04/27/2013 with no evidence of residual/recurrent lymphoma  -Restaging PET scan on 10/24/2013 with no evidence of residual/recurrent lymphoma.  2. History of mucositis secondary to chemotherapy, resolved. 3. History of pancytopenia secondary to chemotherapy. Improved 4. Status post placement of apheresis catheter 01/20/2011. Status post removal of a retained catheter cuff on 06/26/2011. 5. Intermittent fever, 01/19/2011, resolved. Question related to toxicity from high-dose chemotherapy or infectious process. 6. History of Malaise .  7. Skin infection at the right side of the nose requiring hospital admission at [facility] in October of 2012. Improved following antibiotic therapy. There was persistent erythema with a discharge at the distal right nose/nostril on 02/24/2012. A culture was positive for group G. streptococcus and staph aureus. He was treated with amoxicillin. 8. Pulmonary infiltrates on PET scan 05/07/2012. Likely infectious/inflammatory. He completed a course of prednisone 06/08/2012. He underwent an open lung biopsy at [facility] without a specific diagnosis. He was treated with a prolonged prednisone taper and is now maintained off of prednisone.  9. Cushingoid changes secondary to steroids-improved 10. Zoster rash at the right chest December 2013.  Disposition:  Mark [name] remains in clinical remission from mantle cell lymphoma. He will return for an office visit in 6 months. He will stay up-to-date on the influenza and pneumonia vaccines.  Mark Coder, MD  07/02/2015  11:52 AM

## 2015-07-02 NOTE — Telephone Encounter (Signed)
Called [facility] spoke with triage RN, there is no record of pt receiving Prevnar vaccine. Their records show he received pneumoccocal 23 on 10/22/2011 and 05/04/2013. Will make Dr. Benay Spice aware.

## 2015-07-02 NOTE — Telephone Encounter (Signed)
Gave and printed appt sched and avs for pt for Jan 2017 °

## 2015-07-02 NOTE — Addendum Note (Signed)
Addended by: Brien Few on: 07/02/2015 04:54 PM   Modules accepted: Orders

## 2015-07-02 NOTE — Telephone Encounter (Signed)
Left message on voicemail for pt to call office. Will need to schedule Prevnar vaccine here, at [facility] or PCP- per Dr. Benay Spice.

## 2015-08-02 DIAGNOSIS — E291 Testicular hypofunction: Secondary | ICD-10-CM | POA: Diagnosis not present

## 2015-08-16 DIAGNOSIS — N529 Male erectile dysfunction, unspecified: Secondary | ICD-10-CM | POA: Diagnosis not present

## 2015-08-16 DIAGNOSIS — N401 Enlarged prostate with lower urinary tract symptoms: Secondary | ICD-10-CM | POA: Diagnosis not present

## 2015-08-16 DIAGNOSIS — E291 Testicular hypofunction: Secondary | ICD-10-CM | POA: Diagnosis not present

## 2015-09-18 DIAGNOSIS — L570 Actinic keratosis: Secondary | ICD-10-CM | POA: Diagnosis not present

## 2015-09-18 DIAGNOSIS — L820 Inflamed seborrheic keratosis: Secondary | ICD-10-CM | POA: Diagnosis not present

## 2015-09-18 DIAGNOSIS — D485 Neoplasm of uncertain behavior of skin: Secondary | ICD-10-CM | POA: Diagnosis not present

## 2015-09-18 DIAGNOSIS — Z85828 Personal history of other malignant neoplasm of skin: Secondary | ICD-10-CM | POA: Diagnosis not present

## 2015-09-18 DIAGNOSIS — L821 Other seborrheic keratosis: Secondary | ICD-10-CM | POA: Diagnosis not present

## 2015-09-18 DIAGNOSIS — Z85820 Personal history of malignant melanoma of skin: Secondary | ICD-10-CM | POA: Diagnosis not present

## 2015-10-11 DIAGNOSIS — E291 Testicular hypofunction: Secondary | ICD-10-CM | POA: Diagnosis not present

## 2015-10-15 DIAGNOSIS — H26492 Other secondary cataract, left eye: Secondary | ICD-10-CM | POA: Diagnosis not present

## 2015-10-15 DIAGNOSIS — Z0100 Encounter for examination of eyes and vision without abnormal findings: Secondary | ICD-10-CM | POA: Diagnosis not present

## 2015-10-15 DIAGNOSIS — Z961 Presence of intraocular lens: Secondary | ICD-10-CM | POA: Diagnosis not present

## 2015-11-05 ENCOUNTER — Ambulatory Visit (INDEPENDENT_AMBULATORY_CARE_PROVIDER_SITE_OTHER): Payer: Medicare Other | Admitting: Family Medicine

## 2015-11-05 ENCOUNTER — Encounter: Payer: Self-pay | Admitting: Oncology

## 2015-11-05 DIAGNOSIS — Z23 Encounter for immunization: Secondary | ICD-10-CM

## 2015-12-20 DIAGNOSIS — E291 Testicular hypofunction: Secondary | ICD-10-CM | POA: Diagnosis not present

## 2016-01-03 ENCOUNTER — Telehealth: Payer: Self-pay | Admitting: Oncology

## 2016-01-03 ENCOUNTER — Ambulatory Visit (HOSPITAL_BASED_OUTPATIENT_CLINIC_OR_DEPARTMENT_OTHER): Payer: Medicare Other | Admitting: Oncology

## 2016-01-03 VITALS — BP 154/81 | HR 90 | Temp 98.7°F | Resp 18 | Ht 70.5 in | Wt 201.0 lb

## 2016-01-03 DIAGNOSIS — Z23 Encounter for immunization: Secondary | ICD-10-CM

## 2016-01-03 DIAGNOSIS — C8313 Mantle cell lymphoma, intra-abdominal lymph nodes: Secondary | ICD-10-CM

## 2016-01-03 MED ORDER — PNEUMOCOCCAL 13-VAL CONJ VACC IM SUSP
0.5000 mL | Freq: Once | INTRAMUSCULAR | Status: AC
  Administered 2016-01-03: 0.5 mL via INTRAMUSCULAR
  Filled 2016-01-03: qty 0.5

## 2016-01-03 NOTE — Telephone Encounter (Signed)
per pof to sch pt appt-gave pt copy of avs °

## 2016-01-03 NOTE — Progress Notes (Signed)
[place] OFFICE PROGRESS NOTE   Diagnosis: Non-Hodgkin's lymphoma  INTERVAL HISTORY:   Mr. Mosely returns as scheduled. He reports feeling well. He exercises regularly. No dyspnea or fever. He developed "left sciatica "discomfort approximately 1.5 months ago. He thinks he likely injured the back with exercises. This has improved. He continues testosterone injections.  Objective:  Vital signs in last 24 hours:  Blood pressure 154/81, pulse 90, temperature 98.7 F (37.1 C), temperature source Oral, resp. rate 18, height 5' 10.5" (1.791 m), weight 201 lb (91.173 kg), SpO2 99 %.    HEENT: Neck without mass Lymphatics: No cervical, supraclavicular, axillary, or inguinal nodes Resp: Lungs clear bilaterally Cardio: Regular rate and rhythm GI: No hepatomegaly, no mass, nontender Vascular: No leg edema Neuro: The motor exam is intact in the lower chairman the bilaterally    Medications: I have reviewed the patient's current medications.  Assessment/Plan: 1. 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 He completed 2 cycles of R-EPOCH chemotherapy with cycle 2 initiated 12/18/2010. Restaging CT scan on 01/06/2011 revealed a marked decrease in the hypermetabolic mass at the cecum and resolution of hypermetabolic activity and small pelvic lymph nodes. There was remaining hypermetabolic activity with a soft-tissue component at the ileocecal region. He completed a third cycle of R-EPOCH beginning 01/21/2011. He was admitted to [facility] on 02/25/2011 and received etoposide/cytarabine and Rituximab. He was readmitted to [facility] and underwent high-dose chemotherapy followed by autologous stem cell support with a stem cell infusion given on 04/24/2011. -He completed 4 weeks of maintenance rituximab beginning on 08/04/2011. -restaging PET scan 11/04/2011 revealed hypermetabolic activity at the cecum  with mild bowel wall thickening -colonoscopy 11/19/2011 revealed a noninflamed nodular area of uncertain significance at the terminal ileum and a biopsy showed no evidence of mantle cell lymphoma. He began every three-month maintenance Rituxan 12/01/2011, last given 02/24/2012 -restaging PET scan 05/07/12 revealed bilateral hypermetabolic lung infiltrate and no evidence of residual/recurrent lymphoma. -Restaging PET scan 04/27/2013 with no evidence of residual/recurrent lymphoma  -Restaging PET scan on 10/24/2013 with no evidence of residual/recurrent lymphoma.  2. History of mucositis secondary to chemotherapy, resolved. 3. History of pancytopenia secondary to chemotherapy. Improved 4. Status post placement of apheresis catheter 01/20/2011. Status post removal of a retained catheter cuff on 06/26/2011. 5. Intermittent fever, 01/19/2011, resolved. Question related to toxicity from high-dose chemotherapy or infectious process. 6. History of Malaise .  7. Skin infection at the right side of the nose requiring hospital admission at [facility] in October of 2012. Improved following antibiotic therapy. There was persistent erythema with a discharge at the distal right nose/nostril on 02/24/2012. A culture was positive for group G. streptococcus and staph aureus. He was treated with amoxicillin. 8. Pulmonary infiltrates on PET scan 05/07/2012. Likely infectious/inflammatory. He completed a course of prednisone 06/08/2012. He underwent an open lung biopsy at [facility] without a specific diagnosis. He was treated with a prolonged prednisone taper and is now maintained off of prednisone.  9. Cushingoid changes secondary to steroids-improved 10. Zoster rash at the right chest December 2013.    Disposition:  Mr. Gibby remains in remission from mantle cell lymphoma. He received a 13 valent pneumococcal vaccine today. Mr. Walsingham will return for an office visit in 8 months. He is scheduled for an  appointment at [facility] in May. He will seek medical attention if the left leg discomfort does not improved.  Betsy Coder,  MD  01/03/2016  2:10 PM

## 2016-01-21 DIAGNOSIS — N529 Male erectile dysfunction, unspecified: Secondary | ICD-10-CM | POA: Diagnosis not present

## 2016-01-21 DIAGNOSIS — N401 Enlarged prostate with lower urinary tract symptoms: Secondary | ICD-10-CM

## 2016-01-21 DIAGNOSIS — E291 Testicular hypofunction: Secondary | ICD-10-CM | POA: Diagnosis not present

## 2016-01-21 DIAGNOSIS — R3912 Poor urinary stream: Secondary | ICD-10-CM | POA: Diagnosis not present

## 2016-01-21 DIAGNOSIS — N138 Other obstructive and reflux uropathy: Secondary | ICD-10-CM | POA: Insufficient documentation

## 2016-01-21 DIAGNOSIS — R351 Nocturia: Secondary | ICD-10-CM | POA: Diagnosis not present

## 2016-02-28 DIAGNOSIS — E291 Testicular hypofunction: Secondary | ICD-10-CM | POA: Diagnosis not present

## 2016-04-25 DIAGNOSIS — C8310 Mantle cell lymphoma, unspecified site: Secondary | ICD-10-CM | POA: Diagnosis not present

## 2016-04-25 DIAGNOSIS — Z888 Allergy status to other drugs, medicaments and biological substances status: Secondary | ICD-10-CM | POA: Diagnosis not present

## 2016-04-25 DIAGNOSIS — Z8572 Personal history of non-Hodgkin lymphomas: Secondary | ICD-10-CM | POA: Diagnosis not present

## 2016-04-25 DIAGNOSIS — Z08 Encounter for follow-up examination after completed treatment for malignant neoplasm: Secondary | ICD-10-CM | POA: Diagnosis not present

## 2016-04-25 DIAGNOSIS — N521 Erectile dysfunction due to diseases classified elsewhere: Secondary | ICD-10-CM | POA: Diagnosis not present

## 2016-04-25 DIAGNOSIS — Z9484 Stem cells transplant status: Secondary | ICD-10-CM | POA: Diagnosis not present

## 2016-04-25 DIAGNOSIS — Z09 Encounter for follow-up examination after completed treatment for conditions other than malignant neoplasm: Secondary | ICD-10-CM | POA: Diagnosis not present

## 2016-05-08 DIAGNOSIS — E291 Testicular hypofunction: Secondary | ICD-10-CM | POA: Diagnosis not present

## 2016-05-09 ENCOUNTER — Ambulatory Visit (INDEPENDENT_AMBULATORY_CARE_PROVIDER_SITE_OTHER): Payer: Medicare Other | Admitting: Physician Assistant

## 2016-05-09 VITALS — BP 162/102 | HR 74 | Temp 97.5°F | Resp 18 | Ht 70.5 in | Wt 200.0 lb

## 2016-05-09 DIAGNOSIS — IMO0001 Reserved for inherently not codable concepts without codable children: Secondary | ICD-10-CM

## 2016-05-09 DIAGNOSIS — R946 Abnormal results of thyroid function studies: Secondary | ICD-10-CM | POA: Diagnosis not present

## 2016-05-09 DIAGNOSIS — R7989 Other specified abnormal findings of blood chemistry: Secondary | ICD-10-CM

## 2016-05-09 DIAGNOSIS — R030 Elevated blood-pressure reading, without diagnosis of hypertension: Secondary | ICD-10-CM

## 2016-05-09 DIAGNOSIS — K219 Gastro-esophageal reflux disease without esophagitis: Secondary | ICD-10-CM | POA: Insufficient documentation

## 2016-05-09 NOTE — Patient Instructions (Addendum)
Do not check your blood pressure more than one time each day. Let me know if it's consistently >140/90 over the next 2-4 weeks.    IF you received an x-ray today, you will receive an invoice from [facility]. Please contact [facility] at [phone] with questions or concerns regarding your invoice.   IF you received labwork today, you will receive an invoice from [employer]. Please contact [facility] at [phone] with questions or concerns regarding your invoice.   Our billing staff will not be able to assist you with questions regarding bills from these companies.  You will be contacted with the lab results as soon as they are available. The fastest way to get your results is to activate your My Chart account. Instructions are located on the last page of this paperwork. If you have not heard from [place] regarding the results in 2 weeks, please contact this office.

## 2016-05-09 NOTE — Progress Notes (Signed)
Patient ID: Mark [name], male    DOB: [birth year], 65 y.o.   MRN: [id number]  PCP: Kennon Portela, MD  Subjective:   Chief Complaint  Patient presents with  . Thyroid Problem    states his blood work states his level was low; needs blood work (TSH etc)  . Hypertension    states his blood pressure was high; has been higher than it ever has    HPI Presents for evaluation of elevated BP.  5 year Lymphoma visit at [facility] 04/25/16.  TSH was elevated 7.17.  Was advised to follow-up with his PCP.  Yesterday at his urologist for his testosterone injection, BP was 160/110. This AM 132/88. On the trip back here from the beach 150's/90's (his girlfriend is an Therapist, sports and checked every stop they made along the way).  Home monitor 120's/60's until recently it's been creeping up (130's). Over the winter he had sciatica and his exercise waned, but he's back in his previous routine now.  Very unexpectedly have sold their house (it wasn't on the market), and they haven't found a new home yet. He is feeling a little anxious.  Former Emergency planning/management officer.   Care Everywhere chart review. Last TSH was in 2012: 2.394  Overall he feels great. Active, happy.   Review of Systems  Constitutional: Negative.   HENT: Negative.   Eyes: Negative.   Respiratory: Negative.   Cardiovascular: Negative.   Gastrointestinal: Negative.   Endocrine: Negative.   Genitourinary: Negative.   Musculoskeletal: Negative.   Skin: Negative.   Allergic/Immunologic: Negative.   Neurological: Negative.   Hematological: Negative.   Psychiatric/Behavioral: Negative.        Patient Active Problem List   Diagnosis Date Noted  . Acid reflux 05/09/2016  . Benign prostatic hyperplasia with urinary obstruction 01/21/2016  . 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 History of organ or tissue transplant 05/04/2013  . Mantle cell lymphoma of intra-abdominal lymph nodes ([place]) 10/25/2010     Prior  to Admission medications   Medication Sig Start Date End Date Taking? Authorizing Provider  Cholecalciferol (VITAMIN D3) 1000 UNITS CAPS Take 3 capsules by mouth daily.    Yes Historical Provider, MD  cyanocobalamin (,VITAMIN B-12,) 1000 MCG/ML injection Inject 1,000 mcg into the muscle every 30 (thirty) days. 12/17/13  Yes Historical Provider, MD  Multiple Vitamin (DAILY MULTIVITAMIN PO) Take 1 tablet by mouth daily.     Yes Historical Provider, MD  Probiotic Product (ALIGN PO) Take by mouth every other day.    Yes Historical Provider, MD  testosterone cypionate (DEPOTESTOSTERONE CYPIONATE) 200 MG/ML injection Inject into the muscle. Every 10 weeks   Yes Historical Provider, MD  sildenafil (REVATIO) 20 MG tablet Reported on 05/09/2016 08/16/15   Historical Provider, MD  tadalafil (CIALIS) 5 MG tablet Take 5 mg by mouth daily as needed. 11/08/13 11/08/14  Historical Provider, MD     Allergies  Allergen Reactions  . Rituximab     Other reaction(s): Other (See Comments) Pneumonitis in 2013 suspected to be due to Rituxan  . Sulfa Antibiotics Other (See Comments)    Fever----recently completed prolonged course of Bactrim with no reaction 07/15/12       Objective:  Physical Exam  Constitutional: He is oriented to person, place, and time. He appears well-developed and well-nourished. He is active and cooperative. No distress.  BP 162/102 mmHg  Pulse 74  Temp(Src) 97.5 F (36.4 C) (Oral)  Resp 18  Ht 5' 10.5" (1.791  m)  Wt 200 lb (90.719 kg)  BMI 28.28 kg/m2  SpO2 96%  HENT:  Head: Normocephalic and atraumatic.  Right Ear: Hearing normal.  Left Ear: Hearing normal.  Eyes: Conjunctivae are normal. No scleral icterus.  Neck: Normal range of motion. Neck supple. No thyromegaly present.  Cardiovascular: Normal rate, regular rhythm and normal heart sounds.   Pulses:      Radial pulses are 2+ on the right side, and 2+ on the left side.  Pulmonary/Chest: Effort normal and breath sounds normal.    Lymphadenopathy:       Head (right side): No tonsillar, no preauricular, no posterior auricular and no occipital adenopathy present.       Head (left side): No tonsillar, no preauricular, no posterior auricular and no occipital adenopathy present.    He has no cervical adenopathy.       Right: No supraclavicular adenopathy present.       Left: No supraclavicular adenopathy present.  Neurological: He is alert and oriented to person, place, and time. No sensory deficit.  Skin: Skin is warm, dry and intact. No rash noted. No cyanosis or erythema. Nails show no clubbing.  Psychiatric: He has a normal mood and affect. His speech is normal and behavior is normal.           Assessment & Plan:   1. Elevated TSH Await remaining thyroid studies.  - T3, free - T4, free - Thyroid Peroxidase Antibody - TSH  2. Elevated BP Appears to be fluctuating. May need medication, but with 132/88 this morning, would prefer to see what's happening over the next several weeks with regular monitoring. Anxiety likely contributes. Check BP ONE time daily and record. If consistently >140/90 will initiate treatment.   Fara Chute, PA-C Physician Assistant-Certified [facility]

## 2016-05-10 LAB — T3, FREE: T3 FREE: 2.7 pg/mL (ref 2.3–4.2)

## 2016-05-10 LAB — T4, FREE: FREE T4: 1 ng/dL (ref 0.8–1.8)

## 2016-05-10 LAB — TSH: TSH: 8.1 mIU/L — ABNORMAL HIGH (ref 0.40–4.50)

## 2016-05-13 LAB — THYROID PEROXIDASE ANTIBODY: THYROID PEROXIDASE ANTIBODY: 1 [IU]/mL (ref <9)

## 2016-07-07 ENCOUNTER — Ambulatory Visit (INDEPENDENT_AMBULATORY_CARE_PROVIDER_SITE_OTHER): Payer: Medicare Other | Admitting: Family Medicine

## 2016-07-07 ENCOUNTER — Ambulatory Visit (INDEPENDENT_AMBULATORY_CARE_PROVIDER_SITE_OTHER): Payer: Medicare Other

## 2016-07-07 VITALS — BP 138/84 | HR 75 | Temp 98.1°F | Resp 17 | Ht 70.5 in | Wt 195.0 lb

## 2016-07-07 DIAGNOSIS — R0989 Other specified symptoms and signs involving the circulatory and respiratory systems: Secondary | ICD-10-CM

## 2016-07-07 DIAGNOSIS — H6981 Other specified disorders of Eustachian tube, right ear: Secondary | ICD-10-CM | POA: Diagnosis not present

## 2016-07-07 DIAGNOSIS — R05 Cough: Secondary | ICD-10-CM | POA: Diagnosis not present

## 2016-07-07 DIAGNOSIS — Z859 Personal history of malignant neoplasm, unspecified: Secondary | ICD-10-CM

## 2016-07-07 DIAGNOSIS — Z8572 Personal history of non-Hodgkin lymphomas: Secondary | ICD-10-CM

## 2016-07-07 DIAGNOSIS — R059 Cough, unspecified: Secondary | ICD-10-CM

## 2016-07-07 LAB — POCT CBC
GRANULOCYTE PERCENT: 67.7 % (ref 37–80)
HCT, POC: 49.8 % (ref 43.5–53.7)
HEMOGLOBIN: 17.3 g/dL (ref 14.1–18.1)
Lymph, poc: 1.9 (ref 0.6–3.4)
MCH: 33.6 pg — AB (ref 27–31.2)
MCHC: 34.7 g/dL (ref 31.8–35.4)
MCV: 96.9 fL (ref 80–97)
MID (cbc): 0.5 (ref 0–0.9)
MPV: 6.9 fL (ref 0–99.8)
PLATELET COUNT, POC: 163 10*3/uL (ref 142–424)
POC Granulocyte: 4.9 (ref 2–6.9)
POC LYMPH %: 25.5 % (ref 10–50)
POC MID %: 6.8 %M (ref 0–12)
RBC: 5.14 M/uL (ref 4.69–6.13)
RDW, POC: 15.5 %
WBC: 7.3 10*3/uL (ref 4.6–10.2)

## 2016-07-07 MED ORDER — FLUTICASONE PROPIONATE 50 MCG/ACT NA SUSP: NASAL | 1 refills | Status: DC

## 2016-07-07 MED ORDER — PREDNISONE 20 MG PO TABS: ORAL_TABLET | ORAL | 0 refills | Status: DC

## 2016-07-07 NOTE — Progress Notes (Signed)
Patient ID: Mark [name], male    DOB: 1951/08/22  Age: 65 y.o. MRN: [id number]  Chief Complaint  Patient presents with  . Other    ear clogged  . Sore Throat  . Nasal Congestion    Subjective:   65 year old man who has been having problems over the last month or more with head congestion sore throat some cough. He is a Insurance underwriter, has his own plane. He has had some ear symptoms, especially the right ear, from [place] a short flight last week. He is planning to go up to [place] to the air show tomorrow, and is wanting to make sure he gets checked out. 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 He is done well, is followed by an oncologist at [place]. He had normal labs recently a couple of months ago and there is been no evidence of recurrent occurrences. He is not on any regular medicines. He did have a history of a postchemotherapy the groundglass appearance chest x-ray inflammatory process, that apparently resolved with 6 months of steroids.  Current allergies, medications, problem list, past/family and social histories reviewed.  Objective:  BP 138/84 (BP Location: Right Arm, Patient Position: Sitting, Cuff Size: Normal)   Pulse 75   Temp 98.1 F (36.7 C) (Oral)   Resp 17   Ht 5' 10.5" (1.791 m)   Wt 195 lb (88.5 kg)   SpO2 97%   BMI 27.58 kg/m   No major acute distress. Alert and oriented. TMs are normal. Throat looks normal. Neck supple without any nodes. He is a little pain in the right side of his jaw medicated feel anything there. Chest clear. Heart regular without murmurs.  Assessment & Plan:   Assessment: 1. Upper respiratory symptom   2. Cough   3. Eustachian tube dysfunction, right   4. History of malignant lymphoma       Plan: Persistent respiratory symptoms and eustachian tube dysfunction in a man with history of mantle cell lymphoma. I believe this warrants a CBC and chest x-ray before giving any  treatments.  Orders Placed This Encounter  Procedures  . DG Chest 2 View    Order Specific Question:   Reason for Exam (SYMPTOM  OR DIAGNOSIS REQUIRED)    Answer:   cough, congestion, history lymphoma, pilot    Order Specific Question:   Preferred imaging location?    Answer:   External  . POCT CBC    Meds ordered this encounter  Medications  . fluticasone (FLONASE) 50 MCG/ACT nasal spray    Sig: Use 2 sprays each nostril twice daily for 3-4 days, then once daily    Dispense:  16 g    Refill:  1  . predniSONE (DELTASONE) 20 MG tablet    Sig: Take 3 daily for 42 days, then 2 daily for 2 days, then 1 daily for 2 days. Best taken with food after breakfast.    Dispense:  12 tablet    Refill:  0   Results for orders placed or performed in visit on 07/07/16  POCT CBC  Result Value Ref Range   WBC 7.3 4.6 - 10.2 K/uL   Lymph, poc 1.9 0.6 - 3.4   POC LYMPH PERCENT 25.5 10 - 50 %L   MID (cbc) 0.5 0 - 0.9   POC MID % 6.8 0 - 12 %M   POC Granulocyte 4.9 2 - 6.9   Granulocyte percent 67.7  37 - 80 %G   RBC 5.14 4.69 - 6.13 M/uL   Hemoglobin 17.3 14.1 - 18.1 g/dL   HCT, POC 49.8 43.5 - 53.7 %   MCV 96.9 80 - 97 fL   MCH, POC 33.6 (A) 27 - 31.2 pg   MCHC 34.7 31.8 - 35.4 g/dL   RDW, POC 15.5 %   Platelet Count, POC 163 142 - 424 K/uL   MPV 6.9 0 - 99.8 fL         Patient Instructions   Continue to drink plenty of fluids to stay well hydrated. That keeps secretions there.  Use the fluticasone nose spray 2 sprays each nostril twice daily for 3-4 days, then decrease to once daily  Take Allegra-D as needed for congestion of head and eustachian tubes  If you're not improving over the next few days with the fluticasone spray, you can get the prednisone prescription filled and take it. Take 3 pills daily for 2 days, then 2 daily for 2 days, then 1 daily for 2 days. Best taken after breakfast.  If your ears are feeling too tight, use Afrin nose spray before flying     IF you  received an x-ray today, you will receive an invoice from [facility]. Please contact [facility] at [phone] with questions or concerns regarding your invoice.   IF you received labwork today, you will receive an invoice from [employer]. Please contact [facility] at [phone] with questions or concerns regarding your invoice.   Our billing staff will not be able to assist you with questions regarding bills from these companies.  You will be contacted with the lab results as soon as they are available. The fastest way to get your results is to activate your My Chart account. Instructions are located on the last page of this paperwork. If you have not heard from [place] regarding the results in 2 weeks, please contact this office.         Return if symptoms worsen or fail to improve.   Despina Boan, MD 07/07/2016

## 2016-07-07 NOTE — Patient Instructions (Addendum)
Continue to drink plenty of fluids to stay well hydrated. That keeps secretions there.  Use the fluticasone nose spray 2 sprays each nostril twice daily for 3-4 days, then decrease to once daily  Take Allegra-D as needed for congestion of head and eustachian tubes  If you're not improving over the next few days with the fluticasone spray, you can get the prednisone prescription filled and take it. Take 3 pills daily for 2 days, then 2 daily for 2 days, then 1 daily for 2 days. Best taken after breakfast.  If your ears are feeling too tight, use Afrin nose spray before flying     IF you received an x-ray today, you will receive an invoice from [facility]. Please contact [facility] at [phone] with questions or concerns regarding your invoice.   IF you received labwork today, you will receive an invoice from [employer]. Please contact [facility] at [phone] with questions or concerns regarding your invoice.   Our billing staff will not be able to assist you with questions regarding bills from these companies.  You will be contacted with the lab results as soon as they are available. The fastest way to get your results is to activate your My Chart account. Instructions are located on the last page of this paperwork. If you have not heard from [place] regarding the results in 2 weeks, please contact this office.

## 2016-07-15 DIAGNOSIS — E291 Testicular hypofunction: Secondary | ICD-10-CM | POA: Diagnosis not present

## 2016-08-14 DIAGNOSIS — N400 Enlarged prostate without lower urinary tract symptoms: Secondary | ICD-10-CM | POA: Diagnosis not present

## 2016-08-14 DIAGNOSIS — E291 Testicular hypofunction: Secondary | ICD-10-CM | POA: Diagnosis not present

## 2016-08-14 DIAGNOSIS — N529 Male erectile dysfunction, unspecified: Secondary | ICD-10-CM | POA: Diagnosis not present

## 2016-09-01 ENCOUNTER — Ambulatory Visit (HOSPITAL_BASED_OUTPATIENT_CLINIC_OR_DEPARTMENT_OTHER): Payer: Medicare Other | Admitting: Oncology

## 2016-09-01 VITALS — BP 146/79 | HR 82 | Temp 97.4°F | Resp 17 | Ht 70.5 in | Wt 192.2 lb

## 2016-09-01 DIAGNOSIS — Z8572 Personal history of non-Hodgkin lymphomas: Secondary | ICD-10-CM

## 2016-09-01 DIAGNOSIS — C8313 Mantle cell lymphoma, intra-abdominal lymph nodes: Secondary | ICD-10-CM

## 2016-09-01 NOTE — Progress Notes (Signed)
[place] OFFICE PROGRESS NOTE   Diagnosis: Mantle cell lymphoma  INTERVAL HISTORY:   Mark [name] returns as scheduled. [place] feels well[place] fever, dyspnea, or palpable lymph nodes. Good appetite and energy level. He is exercising. He reports mild discomfort in the left lower abdomen recently. He wonders whether this is related to exercise.   Objective:  Vital signs in last 24 hours:  Blood pressure (!) 146/79, pulse 82, temperature 97.4 F (36.3 C), temperature source Oral, resp. rate 17, height 5' 10.5" (1.791 m), weight 192 lb 3.2 oz (87.2 kg), SpO2 98 %.    HEENT: Neck without mass Lymphatics: No cervical, supra-clavicular, axillary, or inguinal nodes Resp: Lungs clear bilaterally Cardio: Regular rate and rhythm GI: No hepatosplenomegaly, no mass, nontender Vascular: No leg edema    Medications: I have reviewed the patient's current medications.  Assessment/Plan: 1. 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 He completed 2 cycles of R-EPOCH chemotherapy with cycle 2 initiated 12/18/2010. Restaging CT scan on 01/06/2011 revealed a marked decrease in the hypermetabolic mass at the cecum and resolution of hypermetabolic activity and small pelvic lymph nodes. There was remaining hypermetabolic activity with a soft-tissue component at the ileocecal region. He completed a third cycle of R-EPOCH beginning 01/21/2011. He was admitted to [facility] on 02/25/2011 and received etoposide/cytarabine and Rituximab. He was readmitted to [facility] and underwent high-dose chemotherapy followed by autologous stem cell support with a stem cell infusion given on 04/24/2011. -He completed 4 weeks of maintenance rituximab beginning on 08/04/2011. -restaging PET scan 11/04/2011 revealed hypermetabolic activity at the cecum with mild bowel wall thickening -colonoscopy 11/19/2011  revealed a noninflamed nodular area of uncertain significance at the terminal ileum and a biopsy showed no evidence of mantle cell lymphoma. He began every three-month maintenance Rituxan 12/01/2011, last given 02/24/2012 -restaging PET scan 05/07/12 revealed bilateral hypermetabolic lung infiltrate and no evidence of residual/recurrent lymphoma. -Restaging PET scan 04/27/2013 with no evidence of residual/recurrent lymphoma  -Restaging PET scan on 10/24/2013 with no evidence of residual/recurrent lymphoma.  2. History of mucositis secondary to chemotherapy, resolved. 3. History of pancytopenia secondary to chemotherapy. Improved 4. Status post placement of apheresis catheter 01/20/2011. Status post removal of a retained catheter cuff on 06/26/2011. 5. Intermittent fever, 01/19/2011, resolved. Question related to toxicity from high-dose chemotherapy or infectious process. 6. History of Malaise .  7. Skin infection at the right side of the nose requiring hospital admission at [facility] in October of 2012. Improved following antibiotic therapy. There was persistent erythema with a discharge at the distal right nose/nostril on 02/24/2012. A culture was positive for group G. streptococcus and staph aureus. He was treated with amoxicillin. 8. Pulmonary infiltrates on PET scan 05/07/2012. Likely infectious/inflammatory. He completed a course of prednisone 06/08/2012. He underwent an open lung biopsy at [facility] without a specific diagnosis. He was treated with a prolonged prednisone taper and is now maintained off of prednisone.  9. Cushingoid changes secondary to steroids-improved 10. Zoster rash at the right chest December 2013.     Disposition:  He remains in clinical remission from Mantle cell lymphoma. He will return for an influenza vaccine on 10/15/2016 and an office visit in 6 months.  Mark [name] will be referred to Dr. Henrene Pastor for a surveillance colonoscopy. He is now greater than 5  years out from being diagnosed with mantle cell lymphoma involving the  cecum.  Betsy Coder, MD  09/01/2016  2:39 PM

## 2016-09-08 ENCOUNTER — Telehealth: Payer: Self-pay | Admitting: Internal Medicine

## 2016-09-08 NOTE — Telephone Encounter (Signed)
Okay to schedule for colonoscopy without office visit

## 2016-09-08 NOTE — Telephone Encounter (Signed)
Pt scheduled for previsit 09/18/16@2 :30pm, colon scheduled in the [place] 10/17/16@2 :30pm. Pt aware of appts.

## 2016-09-08 NOTE — Telephone Encounter (Signed)
Pt with h/o mantle cell lymphoma, saw Dr. Benay Spice for follow-up 09/01/16. Dr. Benay Spice sent referral for pt to have colonoscopy, he is 5 years out from diagnosis with mantle cell lymphoma involving the cecum. Pt has moved to [place] and states he cannot come in for an office visit. Please advise if ok to scheduled pt with colonoscopy.

## 2016-09-09 ENCOUNTER — Telehealth: Payer: Self-pay | Admitting: Oncology

## 2016-09-09 NOTE — Telephone Encounter (Signed)
Left message re November and March appointments and mailed schedule. Velora Heckler will contact patient re GI referral

## 2016-09-18 ENCOUNTER — Ambulatory Visit (AMBULATORY_SURGERY_CENTER): Payer: Self-pay

## 2016-09-18 VITALS — Ht 70.5 in | Wt 192.8 lb

## 2016-09-18 DIAGNOSIS — Z860100 Personal history of colon polyps, unspecified: Secondary | ICD-10-CM

## 2016-09-18 MED ORDER — SUPREP BOWEL PREP KIT 17.5-3.13-1.6 GM/177ML PO SOLN: 1.0000 | Freq: Once | ORAL | 0 refills | Status: AC

## 2016-09-18 NOTE — Progress Notes (Signed)
No allergies to eggs or soy No past problems with anesthesia No diet meds No home oxygen  Declined emmi 

## 2016-09-19 DIAGNOSIS — E291 Testicular hypofunction: Secondary | ICD-10-CM | POA: Diagnosis not present

## 2016-10-15 ENCOUNTER — Ambulatory Visit (HOSPITAL_BASED_OUTPATIENT_CLINIC_OR_DEPARTMENT_OTHER): Payer: Medicare Other

## 2016-10-15 VITALS — BP 160/85 | HR 67 | Temp 97.9°F | Resp 18

## 2016-10-15 DIAGNOSIS — H26492 Other secondary cataract, left eye: Secondary | ICD-10-CM | POA: Diagnosis not present

## 2016-10-15 DIAGNOSIS — Z23 Encounter for immunization: Secondary | ICD-10-CM

## 2016-10-15 DIAGNOSIS — Z961 Presence of intraocular lens: Secondary | ICD-10-CM | POA: Diagnosis not present

## 2016-10-15 DIAGNOSIS — H524 Presbyopia: Secondary | ICD-10-CM | POA: Diagnosis not present

## 2016-10-15 DIAGNOSIS — H04123 Dry eye syndrome of bilateral lacrimal glands: Secondary | ICD-10-CM | POA: Diagnosis not present

## 2016-10-15 MED ORDER — INFLUENZA VAC SPLIT QUAD 0.5 ML IM SUSY
0.5000 mL | PREFILLED_SYRINGE | Freq: Once | INTRAMUSCULAR | Status: AC
  Administered 2016-10-15: 0.5 mL via INTRAMUSCULAR
  Filled 2016-10-15: qty 0.5

## 2016-10-15 NOTE — Patient Instructions (Signed)

## 2016-10-17 ENCOUNTER — Encounter: Payer: 59 | Admitting: Internal Medicine

## 2016-10-22 ENCOUNTER — Ambulatory Visit (AMBULATORY_SURGERY_CENTER): Payer: Medicare Other | Admitting: Internal Medicine

## 2016-10-22 ENCOUNTER — Encounter: Payer: Self-pay | Admitting: Internal Medicine

## 2016-10-22 VITALS — BP 129/89 | HR 62 | Temp 96.4°F | Resp 15 | Ht 70.5 in | Wt 192.0 lb

## 2016-10-22 DIAGNOSIS — Z86010 Personal history of colonic polyps: Secondary | ICD-10-CM

## 2016-10-22 DIAGNOSIS — K219 Gastro-esophageal reflux disease without esophagitis: Secondary | ICD-10-CM | POA: Diagnosis not present

## 2016-10-22 DIAGNOSIS — C8310 Mantle cell lymphoma, unspecified site: Secondary | ICD-10-CM

## 2016-10-22 DIAGNOSIS — Z8572 Personal history of non-Hodgkin lymphomas: Secondary | ICD-10-CM

## 2016-10-22 MED ORDER — SODIUM CHLORIDE 0.9 % IV SOLN
500.0000 mL | INTRAVENOUS | Status: DC
Start: 2016-10-22 — End: 2022-02-06

## 2016-10-22 NOTE — Op Note (Signed)
[place] Patient Name: Mark [name] Procedure Date: 10/22/2016 10:31 AM MRN: [id number] Endoscopist: Docia Chuck. Henrene Pastor , MD Age: 65 Referring MD:  Date of Birth: Jun 07, 1951 Gender: Male Account #: [id number] Procedure:                Colonoscopy, with biopsies Indications:              High risk colon cancer surveillance: Personal                            history of mantle cell lymphoma 2011. Last                            examination 2012 after treatment was negative                            except for diverticulosis Medicines:                Monitored Anesthesia Care Procedure:                Pre-Anesthesia Assessment:                           - Prior to the procedure, a History and Physical                            was performed, and patient medications and                            allergies were reviewed. The patient's tolerance of                            previous anesthesia was also reviewed. The risks                            and benefits of the procedure and the sedation                            options and risks were discussed with the patient.                            All questions were answered, and informed consent                            was obtained. Prior Anticoagulants: The patient has                            taken no previous anticoagulant or antiplatelet                            agents. ASA Grade Assessment: II - A patient with                            mild systemic disease. After reviewing the risks  and benefits, the patient was deemed in                            satisfactory condition to undergo the procedure.                           After obtaining informed consent, the colonoscope                            was passed under direct vision. Throughout the                            procedure, the patient's blood pressure, pulse, and                            oxygen saturations were monitored  continuously. The                            Model CF-HQ190L [id number]) scope was introduced                            through the anus and advanced to the the cecum,                            identified by appendiceal orifice and ileocecal                            valve. The ileocecal valve, appendiceal orifice,                            and rectum were photographed. The quality of the                            bowel preparation was excellent. The colonoscopy                            was performed without difficulty. The patient                            tolerated the procedure well. The bowel preparation                            used was SUPREP. Scope In: 10:42:31 AM Scope Out: 10:57:24 AM Scope Withdrawal Time: 0 hours 12 minutes 9 seconds  Total Procedure Duration: 0 hours 14 minutes 53 seconds  Findings:                 The terminal ileum appeared normal. One area with                            prominent fold. Biopsies were taken with a cold                            forceps for histology.  Multiple small and large-mouthed diverticula were                            found in the left colon.                           The exam was otherwise without abnormality on                            direct and retroflexion views, save internal                            hemorrhoids. Complications:            No immediate complications. Estimated blood loss:                            None. Estimated Blood Loss:     Estimated blood loss: none. Impression:               - The examined portion of the ileum was normal.                            Biopsied.                           - Diverticulosis in the left colon.                           - The examination was otherwise normal on direct                            and retroflexion views. Recommendation:           - Repeat colonoscopy in 5 years for surveillance.                           - Patient has a  contact number available for                            emergencies. The signs and symptoms of potential                            delayed complications were discussed with the                            patient. Return to normal activities tomorrow.                            Written discharge instructions were provided to the                            patient.                           - Resume previous diet.                           -  Continue present medications.                           - Await pathology results. Docia Chuck. Henrene Pastor, MD 10/22/2016 11:09:24 AM This report has been signed electronically. CC Letter to:             Dominica Severin B. Sherrill

## 2016-10-22 NOTE — Patient Instructions (Signed)
YOU HAD AN ENDOSCOPIC PROCEDURE TODAY AT [facility]:   Refer to the procedure report that was given to you for any specific questions about what was found during the examination.  If the procedure report does not answer your questions, please call your gastroenterologist to clarify.  If you requested that your care partner not be given the details of your procedure findings, then the procedure report has been included in a sealed envelope for you to review at your convenience later.  YOU SHOULD EXPECT: Some feelings of bloating in the abdomen. Passage of more gas than usual.  Walking can help get rid of the air that was put into your GI tract during the procedure and reduce the bloating. If you had a lower endoscopy (such as a colonoscopy or flexible sigmoidoscopy) you may notice spotting of blood in your stool or on the toilet paper. If you underwent a bowel prep for your procedure, you may not have a normal bowel movement for a few days.  Please Note:  You might notice some irritation and congestion in your nose or some drainage.  This is from the oxygen used during your procedure.  There is no need for concern and it should clear up in a day or so.  SYMPTOMS TO REPORT IMMEDIATELY:   Following lower endoscopy (colonoscopy or flexible sigmoidoscopy):  Excessive amounts of blood in the stool  Significant tenderness or worsening of abdominal pains  Swelling of the abdomen that is new, acute  Fever of 100F or higher   Following upper endoscopy (EGD)  Vomiting of blood or coffee ground material  New chest pain or pain under the shoulder blades  Painful or persistently difficult swallowing  New shortness of breath  Fever of 100F or higher  Black, tarry-looking stools  For urgent or emergent issues, a gastroenterologist can be reached at any hour by calling [phone].   DIET:  We do recommend a small meal at first, but then you may proceed to your regular diet.  Drink  plenty of fluids but you should avoid alcoholic beverages for 24 hours.  ACTIVITY:  You should plan to take it easy for the rest of today and you should NOT DRIVE or use heavy machinery until tomorrow (because of the sedation medicines used during the test).    FOLLOW UP: Our staff will call the number listed on your records the next business day following your procedure to check on you and address any questions or concerns that you may have regarding the information given to you following your procedure. If we do not reach you, we will leave a message.  However, if you are feeling well and you are not experiencing any problems, there is no need to return our call.  We will assume that you have returned to your regular daily activities without incident.  If any biopsies were taken you will be contacted by phone or by letter within the next 1-3 weeks.  Please call us at [phone] if you have not heard about the biopsies in 3 weeks.    SIGNATURES/CONFIDENTIALITY: You and/or your care partner have signed paperwork which will be entered into your electronic medical record.  These signatures attest to the fact that that the information above on your After Visit Summary has been reviewed and is understood.  Full responsibility of the confidentiality of this discharge information lies with you and/or your care-partner.  Diverticulosis and high fiber diet information given.

## 2016-10-22 NOTE — Progress Notes (Signed)
Called to room to assist during endoscopic procedure.  Patient ID and intended procedure confirmed with present staff. Received instructions for my participation in the procedure from the performing physician.  

## 2016-10-22 NOTE — Progress Notes (Signed)
To PACU, vss patent aw report to rn 

## 2016-10-23 ENCOUNTER — Telehealth: Payer: Self-pay

## 2016-10-23 NOTE — Telephone Encounter (Signed)
  Follow up Call-  Call back number 10/22/2016  Post procedure Call Back phone  # [phone]  Permission to leave phone message Yes  Some recent data might be hidden     Patient questions:  Do you have a fever, pain , or abdominal swelling? No. Pain Score  0 *  Have you tolerated food without any problems? Yes.    Have you been able to return to your normal activities? Yes.    Do you have any questions about your discharge instructions: Diet   No. Medications  No. Follow up visit  No.  Do you have questions or concerns about your Care? No.  Actions: * If pain score is 4 or above: No action needed, pain <4.

## 2016-10-28 ENCOUNTER — Encounter: Payer: Self-pay | Admitting: Internal Medicine

## 2016-11-20 DIAGNOSIS — E291 Testicular hypofunction: Secondary | ICD-10-CM | POA: Diagnosis not present

## 2016-12-11 ENCOUNTER — Other Ambulatory Visit: Payer: Self-pay | Admitting: Nurse Practitioner

## 2017-01-03 DIAGNOSIS — S0101XA Laceration without foreign body of scalp, initial encounter: Secondary | ICD-10-CM | POA: Diagnosis not present

## 2017-01-28 DIAGNOSIS — L814 Other melanin hyperpigmentation: Secondary | ICD-10-CM | POA: Diagnosis not present

## 2017-01-28 DIAGNOSIS — L821 Other seborrheic keratosis: Secondary | ICD-10-CM | POA: Diagnosis not present

## 2017-01-28 DIAGNOSIS — L723 Sebaceous cyst: Secondary | ICD-10-CM | POA: Diagnosis not present

## 2017-01-28 DIAGNOSIS — D485 Neoplasm of uncertain behavior of skin: Secondary | ICD-10-CM | POA: Diagnosis not present

## 2017-01-28 DIAGNOSIS — Z85828 Personal history of other malignant neoplasm of skin: Secondary | ICD-10-CM | POA: Diagnosis not present

## 2017-01-28 DIAGNOSIS — L570 Actinic keratosis: Secondary | ICD-10-CM | POA: Diagnosis not present

## 2017-01-29 DIAGNOSIS — R338 Other retention of urine: Secondary | ICD-10-CM | POA: Diagnosis not present

## 2017-01-29 DIAGNOSIS — E291 Testicular hypofunction: Secondary | ICD-10-CM | POA: Diagnosis not present

## 2017-01-29 DIAGNOSIS — N401 Enlarged prostate with lower urinary tract symptoms: Secondary | ICD-10-CM | POA: Diagnosis not present

## 2017-02-06 DIAGNOSIS — L739 Follicular disorder, unspecified: Secondary | ICD-10-CM | POA: Diagnosis not present

## 2017-02-06 DIAGNOSIS — L0100 Impetigo, unspecified: Secondary | ICD-10-CM | POA: Diagnosis not present

## 2017-02-10 DIAGNOSIS — L0100 Impetigo, unspecified: Secondary | ICD-10-CM | POA: Diagnosis not present

## 2017-02-10 DIAGNOSIS — R030 Elevated blood-pressure reading, without diagnosis of hypertension: Secondary | ICD-10-CM | POA: Diagnosis not present

## 2017-02-11 DIAGNOSIS — L02221 Furuncle of abdominal wall: Secondary | ICD-10-CM | POA: Diagnosis not present

## 2017-02-11 DIAGNOSIS — Z08 Encounter for follow-up examination after completed treatment for malignant neoplasm: Secondary | ICD-10-CM | POA: Diagnosis not present

## 2017-02-11 DIAGNOSIS — Z85828 Personal history of other malignant neoplasm of skin: Secondary | ICD-10-CM | POA: Diagnosis not present

## 2017-03-03 ENCOUNTER — Telehealth: Payer: Self-pay | Admitting: Oncology

## 2017-03-03 ENCOUNTER — Ambulatory Visit (HOSPITAL_BASED_OUTPATIENT_CLINIC_OR_DEPARTMENT_OTHER): Payer: Medicare Other | Admitting: Oncology

## 2017-03-03 VITALS — BP 159/89 | HR 71 | Temp 97.9°F | Resp 18 | Ht 70.5 in | Wt 193.1 lb

## 2017-03-03 DIAGNOSIS — Z8572 Personal history of non-Hodgkin lymphomas: Secondary | ICD-10-CM | POA: Diagnosis not present

## 2017-03-03 DIAGNOSIS — C8313 Mantle cell lymphoma, intra-abdominal lymph nodes: Secondary | ICD-10-CM

## 2017-03-03 NOTE — Progress Notes (Signed)
[place] OFFICE PROGRESS NOTE   Diagnosis: Non-Hodgkin's lymphoma  INTERVAL HISTORY:   Mr. Lo returns as scheduled. He is living at [facility]. He feels well. No palpable lymph nodes. Good appetite and energy level. He developed a "Pseudomonas "infection over the abdominal wall skin after getting in a hot tub. He continues testosterone replacement at [facility]..  Had a colonoscopy by Dr. Henrene Pastor 10/22/2016. A prominent fold at the terminal ileum was biopsied. There was diverticulosis in the left colon. The biopsy returned benign.  Objective:  Vital signs in last 24 hours:  Blood pressure (!) 159/89, pulse 71, temperature 97.9 F (36.6 C), temperature source Oral, resp. rate 18, height 5' 10.5" (1.791 m), weight 193 lb 1 oz (87.6 kg), SpO2 98 %.    HEENT: Neck without mass Lymphatics: No cervical, supraclavicular, axillary, or inguinal nodes Resp: Lungs clear bilaterally Cardio: Regular rate and rhythm GI: No hepatosplenomegaly, no mass, nontender Vascular: No leg edema  Skin: Fading erythematous rash over the abdomen   Medications: I have reviewed the patient's current medications.  Assessment/Plan: 1. Mantle cell lymphoma, diagnosed November 2011 - staging evaluation for mantle cell lymphoma involving a cecal mass and abdominal/pelvic lymph nodes. He completed 2 cycles of R-EPOCH chemotherapy with cycle 2 initiated 12/18/2010. Restaging CT scan on 01/06/2011 revealed a marked decrease in the hypermetabolic mass at the cecum and resolution of hypermetabolic activity and small pelvic lymph nodes. There was remaining hypermetabolic activity with a soft-tissue component at the ileocecal region. He completed a third cycle of R-EPOCH beginning 01/21/2011. He was admitted to [facility] on 02/25/2011 and received etoposide/cytarabine and Rituximab. He was readmitted to [facility] and underwent high-dose chemotherapy followed by autologous stem cell support with a  stem cell infusion given on 04/24/2011. -He completed 4 weeks of maintenance rituximab beginning on 08/04/2011. -restaging PET scan 11/04/2011 revealed hypermetabolic activity at the cecum with mild bowel wall thickening -colonoscopy 11/19/2011 revealed a noninflamed nodular area of uncertain significance at the terminal ileum and a biopsy showed no evidence of mantle cell lymphoma. He began every three-month maintenance Rituxan 12/01/2011, last given 02/24/2012 -restaging PET scan 05/07/12 revealed bilateral hypermetabolic lung infiltrate and no evidence of residual/recurrent lymphoma. -Restaging PET scan 04/27/2013 with no evidence of residual/recurrent lymphoma  -Restaging PET scan on 10/24/2013 with no evidence of residual/recurrent lymphoma.  2. History of mucositis secondary to chemotherapy, resolved. 3. History of pancytopenia secondary to chemotherapy. Improved 4. Status post placement of apheresis catheter 01/20/2011. Status post removal of a retained catheter cuff on 06/26/2011. 5. Intermittent fever, 01/19/2011, resolved. Question related to toxicity from high-dose chemotherapy or infectious process. 6. History of Malaise .  7. Skin infection at the right side of the nose requiring hospital admission at [facility] in October of 2012. Improved following antibiotic therapy. There was persistent erythema with a discharge at the distal right nose/nostril on 02/24/2012. A culture was positive for group G. streptococcus and staph aureus. He was treated with amoxicillin. 8. Pulmonary infiltrates on PET scan 05/07/2012. Likely infectious/inflammatory. He completed a course of prednisone 06/08/2012. He underwent an open lung biopsy at [facility] without a specific diagnosis. He was treated with a prolonged prednisone taper and is now maintained off of prednisone.  9. Cushingoid changes secondary to steroids-improved 10. Zoster rash at the right chest December 2013.      Disposition:  Mr.  Socarras remains in clinical remission from mantle cell lymphoma. He is now greater than 6 years out from  diagnosis. He will return for an office visit in 8 months. He will contact us in the interim for new symptoms.  15 minutes were spent with the patient today. The majority of the time was used for counseling and coordination of care.  Betsy Coder, MD  03/03/2017  1:05 PM

## 2017-03-03 NOTE — Telephone Encounter (Signed)
Appointments scheduled per 03/03/17 los. Patient was given a copy of the AVS report and appointment schedule per 03/03/17 los. °

## 2017-04-09 DIAGNOSIS — E291 Testicular hypofunction: Secondary | ICD-10-CM | POA: Diagnosis not present

## 2017-04-24 DIAGNOSIS — C8310 Mantle cell lymphoma, unspecified site: Secondary | ICD-10-CM | POA: Diagnosis not present

## 2017-04-24 DIAGNOSIS — Z888 Allergy status to other drugs, medicaments and biological substances status: Secondary | ICD-10-CM | POA: Diagnosis not present

## 2017-04-24 DIAGNOSIS — R946 Abnormal results of thyroid function studies: Secondary | ICD-10-CM | POA: Diagnosis not present

## 2017-04-24 DIAGNOSIS — T451X5A Adverse effect of antineoplastic and immunosuppressive drugs, initial encounter: Secondary | ICD-10-CM | POA: Diagnosis not present

## 2017-04-24 DIAGNOSIS — E291 Testicular hypofunction: Secondary | ICD-10-CM | POA: Diagnosis not present

## 2017-04-24 DIAGNOSIS — Z9484 Stem cells transplant status: Secondary | ICD-10-CM | POA: Diagnosis not present

## 2017-04-24 DIAGNOSIS — Z9221 Personal history of antineoplastic chemotherapy: Secondary | ICD-10-CM | POA: Diagnosis not present

## 2017-04-24 DIAGNOSIS — Z5181 Encounter for therapeutic drug level monitoring: Secondary | ICD-10-CM | POA: Diagnosis not present

## 2017-04-24 DIAGNOSIS — R234 Changes in skin texture: Secondary | ICD-10-CM | POA: Diagnosis not present

## 2017-06-09 ENCOUNTER — Telehealth: Payer: Self-pay

## 2017-06-09 NOTE — Telephone Encounter (Signed)
Called pt to schedule Medicare Annual Wellness Visit. -nr  

## 2017-06-18 DIAGNOSIS — E291 Testicular hypofunction: Secondary | ICD-10-CM | POA: Diagnosis not present

## 2017-08-04 DIAGNOSIS — L570 Actinic keratosis: Secondary | ICD-10-CM | POA: Diagnosis not present

## 2017-08-04 DIAGNOSIS — C44612 Basal cell carcinoma of skin of right upper limb, including shoulder: Secondary | ICD-10-CM | POA: Diagnosis not present

## 2017-08-04 DIAGNOSIS — C4441 Basal cell carcinoma of skin of scalp and neck: Secondary | ICD-10-CM | POA: Diagnosis not present

## 2017-08-04 DIAGNOSIS — L821 Other seborrheic keratosis: Secondary | ICD-10-CM | POA: Diagnosis not present

## 2017-08-04 DIAGNOSIS — Z85828 Personal history of other malignant neoplasm of skin: Secondary | ICD-10-CM | POA: Diagnosis not present

## 2017-08-04 DIAGNOSIS — D485 Neoplasm of uncertain behavior of skin: Secondary | ICD-10-CM | POA: Diagnosis not present

## 2017-08-04 DIAGNOSIS — D2272 Melanocytic nevi of left lower limb, including hip: Secondary | ICD-10-CM | POA: Diagnosis not present

## 2017-08-04 DIAGNOSIS — L820 Inflamed seborrheic keratosis: Secondary | ICD-10-CM | POA: Diagnosis not present

## 2017-08-21 DIAGNOSIS — N400 Enlarged prostate without lower urinary tract symptoms: Secondary | ICD-10-CM | POA: Diagnosis not present

## 2017-08-21 DIAGNOSIS — E291 Testicular hypofunction: Secondary | ICD-10-CM | POA: Diagnosis not present

## 2017-08-21 DIAGNOSIS — N529 Male erectile dysfunction, unspecified: Secondary | ICD-10-CM | POA: Diagnosis not present

## 2017-09-02 DIAGNOSIS — C44319 Basal cell carcinoma of skin of other parts of face: Secondary | ICD-10-CM | POA: Diagnosis not present

## 2017-09-02 DIAGNOSIS — Z85828 Personal history of other malignant neoplasm of skin: Secondary | ICD-10-CM | POA: Diagnosis not present

## 2017-10-02 DIAGNOSIS — Z23 Encounter for immunization: Secondary | ICD-10-CM | POA: Diagnosis not present

## 2017-10-15 DIAGNOSIS — H26492 Other secondary cataract, left eye: Secondary | ICD-10-CM | POA: Diagnosis not present

## 2017-10-15 DIAGNOSIS — H52203 Unspecified astigmatism, bilateral: Secondary | ICD-10-CM | POA: Diagnosis not present

## 2017-10-26 DIAGNOSIS — E291 Testicular hypofunction: Secondary | ICD-10-CM | POA: Diagnosis not present

## 2017-10-27 DIAGNOSIS — H26492 Other secondary cataract, left eye: Secondary | ICD-10-CM | POA: Diagnosis not present

## 2017-11-03 ENCOUNTER — Ambulatory Visit: Payer: Medicare Other | Admitting: Oncology

## 2017-11-11 ENCOUNTER — Ambulatory Visit (HOSPITAL_BASED_OUTPATIENT_CLINIC_OR_DEPARTMENT_OTHER): Payer: Medicare Other | Admitting: Nurse Practitioner

## 2017-11-11 ENCOUNTER — Encounter: Payer: Self-pay | Admitting: Nurse Practitioner

## 2017-11-11 VITALS — BP 156/92 | HR 94 | Temp 97.8°F | Resp 18 | Ht 70.5 in | Wt 191.6 lb

## 2017-11-11 DIAGNOSIS — Z8572 Personal history of non-Hodgkin lymphomas: Secondary | ICD-10-CM | POA: Diagnosis not present

## 2017-11-11 DIAGNOSIS — C8313 Mantle cell lymphoma, intra-abdominal lymph nodes: Secondary | ICD-10-CM

## 2017-11-11 NOTE — Progress Notes (Addendum)
[place] OFFICE PROGRESS NOTE   Diagnosis: Non-Hodgkin's lymphoma  INTERVAL HISTORY:   Mark [name] returns as scheduled.  He overall feels well.  A few weeks ago he noted "soreness" at the low abdomen and an isolated "low-grade fever" at 99.4 degrees.  The soreness lasted about a week.  No fevers or sweats.  He has not noticed any enlarged lymph nodes.  He has a good appetite.  He remains very active.  He has not noticed his blood pressure is intermittently elevated and wonders if this is related to the testosterone injection he receives every 10 weeks.  Objective:  Vital signs in last 24 hours:  Blood pressure (!) 156/92, pulse 94, temperature 97.8 F (36.6 C), temperature source Oral, resp. rate 18, height 5' 10.5" (1.791 m), weight 191 lb 9.6 oz (86.9 kg), SpO2 99 %.    HEENT: Neck without mass. Lymphatics: No palpable cervical, supraclavicular, axillary or inguinal lymph nodes. Resp: Lungs clear bilaterally. Cardio: Regular rate and rhythm. GI: Abdomen soft and nontender.  No hepatomegaly.  No splenomegaly.  No mass. Vascular: No leg edema.   Lab Results:  Lab Results  Component Value Date   WBC 7.3 07/07/2016   HGB 17.3 07/07/2016   HCT 49.8 07/07/2016   MCV 96.9 07/07/2016   PLT 168 12/22/2013   NEUTROABS 3.9 12/22/2013    Imaging:  No results found.  Medications: I have reviewed the patient's current medications.  Assessment/Plan: 1. Mantle cell lymphoma, diagnosed November 2011 - staging evaluation for mantle cell lymphoma involving a cecal mass and abdominal/pelvic lymph nodes. He completed 2 cycles of R-EPOCH chemotherapy with cycle 2 initiated 12/18/2010. Restaging CT scan on 01/06/2011 revealed a marked decrease in the hypermetabolic mass at the cecum and resolution of hypermetabolic activity and small pelvic lymph nodes. There was remaining hypermetabolic activity with a soft-tissue component at the ileocecal region. He completed a third  cycle of R-EPOCH beginning 01/21/2011. He was admitted to [facility] on 02/25/2011 and received etoposide/cytarabine and Rituximab. He was readmitted to [facility] and underwent high-dose chemotherapy followed by autologous stem cell support with a stem cell infusion given on 04/24/2011. -He completed 4 weeks of maintenance rituximab beginning on 08/04/2011. -restaging PET scan 11/04/2011 revealed hypermetabolic activity at the cecum with mild bowel wall thickening -colonoscopy 11/19/2011 revealed a noninflamed nodular area of uncertain significance at the terminal ileum and a biopsy showed no evidence of mantle cell lymphoma. He began every three-month maintenance Rituxan 12/01/2011, last given 02/24/2012 -restaging PET scan 05/07/12 revealed bilateral hypermetabolic lung infiltrate and no evidence of residual/recurrent lymphoma. -Restaging PET scan 04/27/2013 with no evidence of residual/recurrent lymphoma  -Restaging PET scan on 10/24/2013 with no evidence of residual/recurrent lymphoma.  2. History of mucositis secondary to chemotherapy, resolved. 3. History of pancytopenia secondary to chemotherapy. Improved 4. Status post placement of apheresis catheter 01/20/2011. Status post removal of a retained catheter cuff on 06/26/2011. 5. Intermittent fever, 01/19/2011, resolved. Question related to toxicity from high-dose chemotherapy or infectious process. 6. History of Malaise .  7. Skin infection at the right side of the nose requiring hospital admission at [facility] in October of 2012. Improved following antibiotic therapy. There was persistent erythema with a discharge at the distal right nose/nostril on 02/24/2012. A culture was positive for group G. streptococcus and staph aureus. He was treated with amoxicillin. 8. Pulmonary infiltrates on PET scan 05/07/2012. Likely infectious/inflammatory. He completed a course of prednisone 06/08/2012. He underwent an open lung biopsy at  [facility] without  a specific diagnosis. He was treated with a prolonged prednisone taper and is now maintained off of prednisone.  9. Cushingoid changes secondary to steroids-improved 10. Zoster rash at the right chest December 2013.   Disposition: Mark [name] remains in clinical remission from mantle cell lymphoma.  He will return for a follow-up visit in 8 months.  He will contact the office in the interim with any problems.  He plans to contact Dr. Rosana Hoes' office regarding the elevated blood pressure readings and testosterone injections.  Patient seen with Dr. Benay Spice.    Ned Card ANP/GNP-BC   11/11/2017  12:35 PM  This was a shared visit with Ned Card.  Mark [name] is in clinical remission from lymphoma.  He will return for an office visit in 8 months.  He will see urology to discuss testosterone therapy, specifically with regard to hypertension and cancer risk.  Julieanne Manson, MD

## 2017-11-12 ENCOUNTER — Telehealth: Payer: Self-pay | Admitting: Nurse Practitioner

## 2017-11-12 NOTE — Telephone Encounter (Signed)
Scheduled appt per 11/28 los - f/u with GBS in 8 months. Sent reminder letter in the mail with appt date and time.

## 2018-03-22 ENCOUNTER — Encounter: Payer: Self-pay | Admitting: Physician Assistant

## 2018-07-12 ENCOUNTER — Inpatient Hospital Stay: Payer: Medicare Other | Attending: Oncology | Admitting: Oncology

## 2018-08-01 ENCOUNTER — Encounter: Payer: Self-pay | Admitting: Nurse Practitioner

## 2018-08-02 NOTE — Telephone Encounter (Signed)
Schedule office Mark [name] or Mark [name] next 1-2 months

## 2018-08-03 ENCOUNTER — Telehealth: Payer: Self-pay | Admitting: Oncology

## 2018-08-03 NOTE — Telephone Encounter (Signed)
Spoke to patient regarding upcoming appts per 8/19 sch message

## 2018-10-12 ENCOUNTER — Inpatient Hospital Stay: Payer: Medicare Other | Attending: Oncology | Admitting: Oncology

## 2018-10-12 VITALS — BP 160/102 | HR 69 | Temp 98.0°F | Resp 18 | Ht 70.5 in | Wt 192.9 lb

## 2018-10-12 DIAGNOSIS — C8313 Mantle cell lymphoma, intra-abdominal lymph nodes: Secondary | ICD-10-CM

## 2018-10-12 DIAGNOSIS — I10 Essential (primary) hypertension: Secondary | ICD-10-CM | POA: Insufficient documentation

## 2018-10-12 DIAGNOSIS — Z8572 Personal history of non-Hodgkin lymphomas: Secondary | ICD-10-CM | POA: Diagnosis present

## 2018-10-12 NOTE — Progress Notes (Signed)
[place] OFFICE [name] NOTE   Diagnosis: Mantle cell lymphoma  INTERVAL HISTORY:   Mark [name] returns as scheduled.  He feels well.  Good appetite and energy level.  No palpable lymph nodes.  No fever or night sweats.  No recent infection.  He has been diagnosed with hypertension.  Primary physician at the [facility].  He continues yearly follow-up at [facility].  Objective:  Vital signs in last 24 hours:  Blood pressure (!) 160/102, pulse 69, temperature 98 F (36.7 C), temperature source Oral, resp. rate 18, height 5' 10.5" (1.791 m), weight 192 lb 14.4 oz (87.5 kg), SpO2 99 %.    HEENT: Neck without mass Lymphatics: No cervical, supraclavicular, axillary, or inguinal nodes Resp: Lungs clear bilaterally Cardio: Regular rate and rhythm GI: No hepatosplenomegaly, nontender, no mass Vascular: No leg edema   Lab Results:  Lab Results  Component Value Date   WBC 7.3 07/07/2016   HGB 17.3 07/07/2016   HCT 49.8 07/07/2016   MCV 96.9 07/07/2016   PLT 168 12/22/2013   NEUTROABS 3.9 12/22/2013    CMP  Lab Results  Component Value Date   NA 140 09/26/2011   K 4.4 09/26/2011   CL 105 09/26/2011   CO2 22 09/26/2011   GLUCOSE 91 09/26/2011   BUN 19 09/26/2011   CREATININE 1.00 09/26/2011   CALCIUM 9.0 09/26/2011   PROT 6.1 09/26/2011   ALBUMIN 4.2 09/26/2011   AST 22 09/26/2011   ALT 15 09/26/2011   ALKPHOS 55 09/26/2011   BILITOT 1.1 09/26/2011   GFRNONAA >60 11/29/2010   GFRAA  11/29/2010    >60        The eGFR has been calculated using the MDRD equation. This calculation has not been validated in all clinical situations. eGFR's persistently <60 mL/min signify possible Chronic Kidney Disease.    Medications: I have reviewed the patient's current medications.   Assessment/Plan: 1. Mantle cell lymphoma, diagnosed November 2011- staging evaluation for mantle cell lymphoma involving a cecal mass and abdominal/pelvic lymph nodes. He  completed 2 cycles of R-EPOCH chemotherapy with cycle 2 initiated 12/18/2010. Restaging CT scan on 01/06/2011 revealed a marked decrease in the hypermetabolic mass at the cecum and resolution of hypermetabolic activity and small pelvic lymph nodes. There was remaining hypermetabolic activity with a soft-tissue component at the ileocecal region. He completed a third cycle of R-EPOCH beginning 01/21/2011. He was admitted to [facility] on 02/25/2011 and received etoposide/cytarabine and Rituximab. He was readmitted to [facility] and underwent high-dose chemotherapy followed by autologous stem cell support with a stem cell infusion given on 04/24/2011. -He completed 4 weeks of maintenance rituximab beginning on 08/04/2011. -restaging PET scan 11/04/2011 revealed hypermetabolic activity at the cecum with mild bowel wall thickening -colonoscopy 11/19/2011 revealed a noninflamed nodular area of uncertain significance at the terminal ileum and a biopsy showed no evidence of mantle cell lymphoma. He began every three-month maintenance Rituxan 12/01/2011, last given 02/24/2012 -restaging PET scan 05/07/12 revealed bilateral hypermetabolic lung infiltrate and no evidence of residual/recurrent lymphoma. -Restaging PET scan 04/27/2013 with no evidence of residual/recurrent lymphoma  -Restaging PET scan on 10/24/2013 with no evidence of residual/recurrent lymphoma.  2. History of mucositis secondary to chemotherapy, resolved. 3. History of pancytopenia secondary to chemotherapy. Improved 4. Status post placement of apheresis catheter 01/20/2011. Status post removal of a retained catheter cuff on 06/26/2011. 5. Intermittent fever, 01/19/2011, resolved. Question related to toxicity from high-dose chemotherapy or infectious process. 6. History of Malaise .  7. Skin infection at the right side of the nose requiring hospital admission at [facility] in October of 2012. Improved following antibiotic therapy. There was  persistent erythema with a discharge at the distal right nose/nostril on 02/24/2012. A culture was positive for group G. streptococcus and staph aureus. He was treated with amoxicillin. 8. Pulmonary infiltrates on PET scan 05/07/2012. Likely infectious/inflammatory. He completed a course of prednisone 06/08/2012. He underwent an open lung biopsy at [facility] without a specific diagnosis. He was treated with a prolonged prednisone taper and is now maintained off of prednisone.  9. Cushingoid changes secondary to steroids-improved 10. Zoster rash at the right chest December 2013.     Disposition: Mark [name] remains in clinical remission for mantle cell lymphoma.  He will continue yearly follow-up here and at [facility].  He has received an influenza vaccine. He will follow-up with his primary care physician for management of hypertension.  The hypertension may be in part related to testosterone therapy.   15 minutes were spent with the patient today.  The majority of the time was used for counseling and coordination of care.  Mark Sherrill, MD  10/12/2018  12:15 PM   

## 2018-10-12 NOTE — Patient Instructions (Signed)
Please bring copy of Advanced Directive/Living Will at next appointment to be scanned into chart. Follow up with your primary care MD regarding BP

## 2018-10-14 ENCOUNTER — Telehealth: Payer: Self-pay | Admitting: Oncology

## 2018-10-14 NOTE — Telephone Encounter (Signed)
Scheduled appt per 10/29 los - sent reminder letter in the mail with appt date and time.   

## 2019-11-03 ENCOUNTER — Telehealth: Payer: Self-pay | Admitting: Oncology

## 2019-11-03 NOTE — Telephone Encounter (Signed)
Called patient regarding 11/27 appointment, patient agreed to reschedule for 3 months out per providers request. 11/27 appointment moved to 02/19.

## 2019-11-11 ENCOUNTER — Ambulatory Visit: Payer: Medicare Other | Admitting: Oncology

## 2020-01-11 ENCOUNTER — Telehealth: Payer: Self-pay | Admitting: Oncology

## 2020-01-11 NOTE — Telephone Encounter (Signed)
Rescheduled per 1/26 sch msg, per pt's req. Called and spoke with pt, confirmed 2/2 appt

## 2020-01-17 ENCOUNTER — Inpatient Hospital Stay: Payer: Medicare Other | Attending: Oncology | Admitting: Oncology

## 2020-01-17 VITALS — BP 146/82 | HR 85 | Temp 98.1°F | Resp 17 | Ht 70.5 in | Wt 190.6 lb

## 2020-01-17 DIAGNOSIS — Z9221 Personal history of antineoplastic chemotherapy: Secondary | ICD-10-CM | POA: Diagnosis not present

## 2020-01-17 DIAGNOSIS — Z8572 Personal history of non-Hodgkin lymphomas: Secondary | ICD-10-CM | POA: Insufficient documentation

## 2020-01-17 DIAGNOSIS — C8313 Mantle cell lymphoma, intra-abdominal lymph nodes: Secondary | ICD-10-CM | POA: Diagnosis not present

## 2020-01-17 NOTE — Progress Notes (Signed)
  [name] OFFICE PROGRESS NOTE   Diagnosis: Non-Hodgkin's lymphoma  INTERVAL HISTORY:   Mark [name] returns as scheduled.  Mark [name] feels well.  No fever, night sweats, or dyspnea.  No consistent abdominal pain.  Good appetite and energy level.  Mark [name] is exercising.  Objective:  Vital signs in last 24 hours:  Blood pressure (!) 154/98, pulse 85, temperature 98.1 F (36.7 C), temperature source Temporal, resp. rate 17, height 5' 10.5" (1.791 m), weight 190 lb 9.6 oz (86.5 kg), SpO2 98 %.    HEENT: Neck without mass Lymphatics: No cervical, supraclavicular, axillary, or inguinal nodes Resp: Lungs clear bilaterally Cardio: Regular rate and rhythm GI: No hepatosplenomegaly, no mass, nontender Vascular: No leg edema    Medications: I have reviewed the patient's current medications.   Assessment/Plan:  1. Mantle cell lymphoma, diagnosed November 2011- staging evaluation for mantle cell lymphoma involving a cecal mass and abdominal/pelvic lymph nodes. Mark [name] completed 2 cycles of R-EPOCH chemotherapy with cycle 2 initiated 12/18/2010. Restaging CT scan on 01/06/2011 revealed a marked decrease in the hypermetabolic mass at the cecum and resolution of hypermetabolic activity and small pelvic lymph nodes. There was remaining hypermetabolic activity with a soft-tissue component at the ileocecal region. Mark [name] completed a third cycle of R-EPOCH beginning 01/21/2011. Mark [name] was admitted to [facility] on 02/25/2011 and received etoposide/cytarabine and Rituximab. Mark [name] was readmitted to [facility] and underwent high-dose chemotherapy followed by autologous stem cell support with a stem cell infusion given on 04/24/2011. -Mark [name] completed 4 weeks of maintenance rituximab beginning on 08/04/2011. -restaging PET scan 11/04/2011 revealed hypermetabolic activity at the cecum with mild bowel wall thickening -colonoscopy 11/19/2011 revealed a noninflamed nodular area of uncertain significance at the terminal  ileum and a biopsy showed no evidence of mantle cell lymphoma. Mark [name] began every three-month maintenance Rituxan 12/01/2011, last given 02/24/2012 -restaging PET scan 05/07/12 revealed bilateral hypermetabolic lung infiltrate and no evidence of residual/recurrent lymphoma. -Restaging PET scan 04/27/2013 with no evidence of residual/recurrent lymphoma  -Restaging PET scan on 10/24/2013 with no evidence of residual/recurrent lymphoma.  2. History of mucositis secondary to chemotherapy, resolved. 3. History of pancytopenia secondary to chemotherapy. Improved 4. Status post placement of apheresis catheter 01/20/2011. Status post removal of a retained catheter cuff on 06/26/2011. 5. Intermittent fever, 01/19/2011, resolved. Question related to toxicity from high-dose chemotherapy or infectious process. 6. History of Malaise .  7. Skin infection at the right side of the nose requiring hospital admission at [facility] in October of 2012. Improved following antibiotic therapy. There was persistent erythema with a discharge at the distal right nose/nostril on 02/24/2012. A culture was positive for group G. streptococcus and staph aureus. Mark [name] was treated with amoxicillin. 8. Pulmonary infiltrates on PET scan 05/07/2012. Likely infectious/inflammatory. Mark [name] completed a course of prednisone 06/08/2012. Mark [name] underwent an open lung biopsy at [facility] without a specific diagnosis. Mark [name] was treated with a prolonged prednisone taper and is now maintained off of prednisone.  9. Cushingoid changes secondary to steroids-improved 10. Zoster rash at the right chest December 2013.     Disposition: Mark [name] is in remission from non-Hodgkin's lymphoma.  Mark [name] Mark [name] is scheduled for an appointment at [facility] in May.  Mark [name] will return for an office visit in 1 year.  Mark [name] is registered to obtain the COVID-19 vaccine.  Betsy Coder, MD  01/17/2020  4:39 PM

## 2020-01-19 ENCOUNTER — Telehealth: Payer: Self-pay | Admitting: Oncology

## 2020-01-19 NOTE — Telephone Encounter (Signed)
Scheduled per los. Called and left msg. Mailed printout  °

## 2020-02-03 ENCOUNTER — Ambulatory Visit: Payer: Medicare Other | Admitting: Oncology

## 2021-01-15 ENCOUNTER — Inpatient Hospital Stay: Payer: Medicare Other | Admitting: Oncology

## 2021-10-31 ENCOUNTER — Encounter: Payer: Self-pay | Admitting: Internal Medicine

## 2021-12-05 ENCOUNTER — Encounter: Payer: Self-pay | Admitting: Internal Medicine

## 2022-01-23 ENCOUNTER — Other Ambulatory Visit: Payer: Self-pay

## 2022-01-23 ENCOUNTER — Ambulatory Visit (AMBULATORY_SURGERY_CENTER): Payer: Self-pay

## 2022-01-23 VITALS — Ht 70.0 in | Wt 180.0 lb

## 2022-01-23 DIAGNOSIS — Z86010 Personal history of colonic polyps: Secondary | ICD-10-CM

## 2022-01-23 MED ORDER — PEG 3350-KCL-NA BICARB-NACL 420 G PO SOLR: 4000.0000 mL | Freq: Once | ORAL | 0 refills | Status: AC

## 2022-01-23 NOTE — Progress Notes (Signed)
Denies allergies to eggs or soy products. Denies complication of anesthesia or sedation. Denies use of weight loss medication. Denies use of O2.   Emmi instructions given for colonoscopy.  

## 2022-02-06 ENCOUNTER — Other Ambulatory Visit: Payer: Self-pay

## 2022-02-06 ENCOUNTER — Ambulatory Visit (AMBULATORY_SURGERY_CENTER): Payer: Medicare Other | Admitting: Internal Medicine

## 2022-02-06 ENCOUNTER — Encounter: Payer: Self-pay | Admitting: Internal Medicine

## 2022-02-06 VITALS — BP 135/92 | HR 64 | Temp 97.9°F | Resp 15 | Ht 70.5 in | Wt 180.0 lb

## 2022-02-06 DIAGNOSIS — Z86010 Personal history of colonic polyps: Secondary | ICD-10-CM

## 2022-02-06 DIAGNOSIS — C8310 Mantle cell lymphoma, unspecified site: Secondary | ICD-10-CM

## 2022-02-06 DIAGNOSIS — K5730 Diverticulosis of large intestine without perforation or abscess without bleeding: Secondary | ICD-10-CM

## 2022-02-06 MED ORDER — SODIUM CHLORIDE 0.9 % IV SOLN
500.0000 mL | Freq: Once | INTRAVENOUS | Status: DC
Start: 2022-02-06 — End: 2022-02-06

## 2022-02-06 NOTE — Progress Notes (Signed)
Called to room to assist during endoscopic procedure.  Patient ID and intended procedure confirmed with present staff. Received instructions for my participation in the procedure from the performing physician.  

## 2022-02-06 NOTE — Progress Notes (Signed)
HISTORY OF PRESENT ILLNESS:  Mark [name] is a 71 y.o. male with a history of mantle cell lymphoma of the ileum found incidentally on routine screening colonoscopy 2011.  He has been treated.  Has had periodic follow-up.  Last examination 2017.  He is now for surveillance.  No active complaints  REVIEW OF SYSTEMS:  All non-GI ROS negative. 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 He has never used smokeless tobacco. He reports current alcohol use of about 5.0 - 7.0 standard drinks per week. He reports that he does not use drugs.  family history includes Breast cancer in his mother; Cancer (age of onset: 30) in his brother.  Allergies  Allergen Reactions   Rituximab     Other reaction(s): Other (See Comments) Pneumonitis in 2013 suspected to be due to Rituxan   Sulfa Antibiotics Other (See Comments)    Fever----recently completed prolonged course of Bactrim with no reaction 07/15/12       PHYSICAL EXAMINATION:  Vital signs: BP 119/72    Pulse 73    Temp 97.9 F (36.6 C) (Skin)    Resp 14    Ht 5' 10.5" (1.791 m)    Wt 180 lb (81.6 kg)    SpO2 93%    BMI 25.46 kg/m  General: Well-developed, well-nourished, no acute distress HEENT: Sclerae are anicteric, conjunctiva pink. Oral mucosa intact Lungs: Clear Heart: Regular Abdomen: soft, nontender, nondistended, no obvious ascites, no peritoneal signs, normal bowel sounds. No organomegaly. Extremities: No edema Psychiatric: alert and oriented x3. Cooperative     ASSESSMENT:  1.  History of mantle cell lymphoma of  the ileum   PLAN:  1.  Surveillance colonoscopy

## 2022-02-06 NOTE — Op Note (Signed)
[name] Patient Name: Mark [name] Procedure Date: 02/06/2022 8:13 AM MRN: [id number] Endoscopist: Docia Chuck. Henrene Pastor , MD Age: 71 Referring MD:  Date of Birth: Aug 25, 1951 Gender: Male Account #: [id number] Procedure:                Colonoscopy with biopsies Indications:              High risk colon cancer surveillance: Personal                            history of mantle cell lymphoma of the ileum                            diagnosed 2011 (found incidentally). Treated.                            Surveillance examinations 2012 and 2017 negative                            for neoplasia or other recurrent Medicines:                Monitored Anesthesia Care Procedure:                Pre-Anesthesia Assessment:                           - Prior to the procedure, a History and Physical                            was performed, and patient medications and                            allergies were reviewed. The patient's tolerance of                            previous anesthesia was also reviewed. The risks                            and benefits of the procedure and the sedation                            options and risks were discussed with the patient.                            All questions were answered, and informed consent                            was obtained. Prior Anticoagulants: The patient has                            taken no previous anticoagulant or antiplatelet                            agents. ASA Grade Assessment: II - A patient with  mild systemic disease. After reviewing the risks                            and benefits, the patient was deemed in                            satisfactory condition to undergo the procedure.                           After obtaining informed consent, the colonoscope                            was passed under direct vision. Throughout the                            procedure, the patient's blood  pressure, pulse, and                            oxygen saturations were monitored continuously. The                            CF [id number] #[id number] was introduced through the anus                            and advanced to the the cecum, identified by                            appendiceal orifice and ileocecal valve. The                            terminal ileum, ileocecal valve, appendiceal                            orifice, and rectum were photographed. The quality                            of the bowel preparation was excellent. The                            colonoscopy was performed without difficulty. The                            patient tolerated the procedure well. The bowel                            preparation used was SUPREP via split dose                            instruction. Scope In: 8:23:53 AM Scope Out: 8:37:08 AM Scope Withdrawal Time: 0 hours 10 minutes 56 seconds  Total Procedure Duration: 0 hours 13 minutes 15 seconds  Findings:                 The terminal ileum appeared normal. Biopsies were  taken with a cold forceps for histology.                           Multiple diverticula were found in the left colon.                           Internal hemorrhoids were found during                            retroflexion. The hemorrhoids were small.                           The exam was otherwise without abnormality on                            direct and retroflexion views. Complications:            No immediate complications. Estimated blood loss:                            None. Estimated Blood Loss:     Estimated blood loss: none. Impression:               - The examined portion of the ileum was normal.                            Biopsied.                           - Diverticulosis in the left colon.                           - Internal hemorrhoids.                           - The examination was otherwise normal on direct                             and retroflexion views. Recommendation:           - Repeat colonoscopy in 5 years for surveillance.                           - Patient has a contact number available for                            emergencies. The signs and symptoms of potential                            delayed complications were discussed with the                            patient. Return to normal activities tomorrow.                            Written discharge instructions were provided to the  patient.                           - Resume previous diet.                           - Continue present medications.                           - Await pathology results. Docia Chuck. Henrene Pastor, MD 02/06/2022 9:02:24 AM This report has been signed electronically.

## 2022-02-06 NOTE — Progress Notes (Signed)
Pt's states no medical or surgical changes since previsit or office visit. VS assessed by C.W 

## 2022-02-06 NOTE — Progress Notes (Signed)
Report to PACU, RN, vss, BBS= Clear.  

## 2022-02-06 NOTE — Patient Instructions (Addendum)
Thank you for allowing [place] to care for you today! Await results, approximately 2 weeks.  Recommend surveillance colonoscopy in 5 years. Resume previous diet and medications today.  Return to normal daily activities tomorrow.   YOU HAD AN ENDOSCOPIC PROCEDURE TODAY AT [facility]:   Refer to the procedure report that was given to you for any specific questions about what was found during the examination.  If the procedure report does not answer your questions, please call your gastroenterologist to clarify.  If you requested that your care partner not be given the details of your procedure findings, then the procedure report has been included in a sealed envelope for you to review at your convenience later.  YOU SHOULD EXPECT: Some feelings of bloating in the abdomen. Passage of more gas than usual.  Walking can help get rid of the air that was put into your GI tract during the procedure and reduce the bloating. If you had a lower endoscopy (such as a colonoscopy or flexible sigmoidoscopy) you may notice spotting of blood in your stool or on the toilet paper. If you underwent a bowel prep for your procedure, you may not have a normal bowel movement for a few days.  Please Note:  You might notice some irritation and congestion in your nose or some drainage.  This is from the oxygen used during your procedure.  There is no need for concern and it should clear up in a day or so.  SYMPTOMS TO REPORT IMMEDIATELY:  Following lower endoscopy (colonoscopy or flexible sigmoidoscopy):  Excessive amounts of blood in the stool  Significant tenderness or worsening of abdominal pains  Swelling of the abdomen that is new, acute  Fever of 100F or higher    For urgent or emergent issues, a gastroenterologist can be reached at any hour by calling [phone]. Do not use MyChart messaging for urgent concerns.    DIET:  We do recommend a small meal at first, but then you may proceed to your  regular diet.  Drink plenty of fluids but you should avoid alcoholic beverages for 24 hours.  ACTIVITY:  You should plan to take it easy for the rest of today and you should NOT DRIVE or use heavy machinery until tomorrow (because of the sedation medicines used during the test).    FOLLOW UP: Our staff will call the number listed on your records 48-72 hours following your procedure to check on you and address any questions or concerns that you may have regarding the information given to you following your procedure. If we do not reach you, we will leave a message.  We will attempt to reach you two times.  During this call, we will ask if you have developed any symptoms of COVID 19. If you develop any symptoms (ie: fever, flu-like symptoms, shortness of breath, cough etc.) before then, please call [phone].  If you test positive for Covid 19 in the 2 weeks post procedure, please call and report this information to [place].    If any biopsies were taken you will be contacted by phone or by letter within the next 1-3 weeks.  Please call us at [phone] if you have not heard about the biopsies in 3 weeks.    SIGNATURES/CONFIDENTIALITY: You and/or your care partner have signed paperwork which will be entered into your electronic medical record.  These signatures attest to the fact that that the information above on your After Visit Summary has been reviewed  and is understood.  Full responsibility of the confidentiality of this discharge information lies with you and/or your care-partner.

## 2022-02-10 ENCOUNTER — Encounter: Payer: Self-pay | Admitting: Internal Medicine

## 2022-02-10 ENCOUNTER — Telehealth: Payer: Self-pay | Admitting: *Deleted

## 2022-02-10 NOTE — Telephone Encounter (Signed)
°  Follow up Call-  Call back number 02/06/2022  Post procedure Call Back phone  # [phone]  Permission to leave phone message Yes  Some recent data might be hidden     Patient questions:  Do you have a fever, pain , or abdominal swelling? No. Pain Score  0 *  Have you tolerated food without any problems? Yes.    Have you been able to return to your normal activities? Yes.    Do you have any questions about your discharge instructions: Diet   No. Medications  No. Follow up visit  No.  Do you have questions or concerns about your Care? No.  Actions: * If pain score is 4 or above: No action needed, pain <4.
